# Patient Record
Sex: Male | Born: 1990 | Race: White | Hispanic: No | Marital: Single | Smoking: Current every day smoker
Health system: Southern US, Community
[De-identification: ages and names within clinical notes are randomized; demographics above are authoritative.]

## PROBLEM LIST (undated history)

## (undated) HISTORY — PX: ABDOMINAL SURGERY: SHX537

---
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
## 2010-03-04 ENCOUNTER — Emergency Department (HOSPITAL_COMMUNITY): Admission: EM | Admit: 2010-03-04 | Discharge: 2010-03-04 | Payer: Self-pay | Admitting: Emergency Medicine

## 2011-02-01 LAB — HEMOGLOBIN AND HEMATOCRIT, BLOOD
HCT: 39.8 % (ref 39.0–52.0)
HCT: 40.9 % (ref 39.0–52.0)
Hemoglobin: 13.7 g/dL (ref 13.0–17.0)

## 2011-02-01 LAB — BASIC METABOLIC PANEL
BUN: 9 mg/dL (ref 6–23)
CO2: 27 mEq/L (ref 19–32)
CO2: 31 mEq/L (ref 19–32)
Calcium: 9.1 mg/dL (ref 8.4–10.5)
Calcium: 9.8 mg/dL (ref 8.4–10.5)
Chloride: 102 mEq/L (ref 96–112)
Creatinine, Ser: 0.76 mg/dL (ref 0.4–1.5)
GFR calc non Af Amer: >60 mL/min (ref >60)
Sodium: 135 mEq/L (ref 135–145)
Sodium: 138 mEq/L (ref 135–145)

## 2011-02-01 LAB — DIFFERENTIAL
Basophils Absolute: 0 10*3/uL (ref 0.0–0.1)
Eosinophils Absolute: 0 10*3/uL (ref 0.0–0.7)
Eosinophils Relative: 0 % (ref 0–5)
Lymphocytes Relative: 18 % (ref 12–46)
Lymphs Abs: 1.5 10*3/uL (ref 0.7–4.0)
Monocytes Absolute: 0.7 10*3/uL (ref 0.1–1.0)
Neutrophils Relative %: 73 % (ref 43–77)

## 2011-02-01 LAB — CBC
MCHC: 33.8 g/dL (ref 30.0–36.0)
MCV: 91 fL (ref 78.0–100.0)
MCV: 92.6 fL (ref 78.0–100.0)
Platelets: 220 10*3/uL (ref 150–400)
RBC: 4.66 MIL/uL (ref 4.22–5.81)
RBC: 4.76 MIL/uL (ref 4.22–5.81)
RDW: 12.8 % (ref 11.5–15.5)
WBC: 10.4 10*3/uL (ref 4.0–10.5)
WBC: 8.2 10*3/uL (ref 4.0–10.5)

## 2011-02-12 LAB — POCT RAPID STREP A (OFFICE): Streptococcus, Group A Screen (Direct): NEGATIVE

## 2011-06-27 ENCOUNTER — Inpatient Hospital Stay (INDEPENDENT_AMBULATORY_CARE_PROVIDER_SITE_OTHER)
Admission: RE | Admit: 2011-06-27 | Discharge: 2011-06-27 | Disposition: A | Discharge status: Home / Self Care | Payer: BC Managed Care – PPO | Source: Ambulatory Visit | Attending: Family Medicine | Admitting: Family Medicine

## 2011-06-27 DIAGNOSIS — R319 Hematuria, unspecified: Secondary | ICD-10-CM

## 2011-06-27 LAB — COMPREHENSIVE METABOLIC PANEL WITH GFR
ALT: 30 U/L (ref 0–53)
AST: 25 U/L (ref 0–37)
Albumin: 4.6 g/dL (ref 3.5–5.2)
Alkaline Phosphatase: 75 U/L (ref 39–117)
BUN: 14 mg/dL (ref 6–23)
CO2: 28 meq/L (ref 19–32)
Calcium: 10.1 mg/dL (ref 8.4–10.5)
Chloride: 103 meq/L (ref 96–112)
Creatinine, Ser: 0.98 mg/dL (ref 0.50–1.35)
GFR calc Af Amer: >60 mL/min
GFR calc non Af Amer: >60 mL/min
Glucose, Bld: 92 mg/dL (ref 70–99)
Potassium: 4.3 meq/L (ref 3.5–5.1)
Sodium: 140 meq/L (ref 135–145)
Total Bilirubin: 0.7 mg/dL (ref 0.3–1.2)
Total Protein: 7.9 g/dL (ref 6.0–8.3)

## 2011-06-27 LAB — CBC
HCT: 43.9 % (ref 39.0–52.0)
Hemoglobin: 15.7 g/dL (ref 13.0–17.0)
MCH: 31.6 pg (ref 26.0–34.0)
MCHC: 35.8 g/dL (ref 30.0–36.0)
MCV: 88.3 fL (ref 78.0–100.0)
Platelets: 227 10*3/uL (ref 150–400)
RBC: 4.97 MIL/uL (ref 4.22–5.81)
RDW: 12.2 % (ref 11.5–15.5)
WBC: 4.8 10*3/uL (ref 4.0–10.5)

## 2011-06-27 LAB — POCT URINALYSIS DIP (DEVICE)
Bilirubin Urine: NEGATIVE
Glucose, UA: NEGATIVE mg/dL
Ketones, ur: NEGATIVE mg/dL
Leukocytes, UA: NEGATIVE
Nitrite: NEGATIVE
Protein, ur: NEGATIVE mg/dL
Specific Gravity, Urine: 1.015 (ref 1.005–1.030)
Urobilinogen, UA: 0.2 mg/dL (ref 0.0–1.0)
pH: 8.5 — ABNORMAL HIGH (ref 5.0–8.0)

## 2011-06-27 LAB — SEDIMENTATION RATE: Sed Rate: 2 mm/h (ref 0–16)

## 2011-06-27 LAB — DIFFERENTIAL
Basophils Absolute: 0 10*3/uL (ref 0.0–0.1)
Basophils Relative: 1 % (ref 0–1)
Lymphocytes Relative: 36 % (ref 12–46)
Neutro Abs: 2.3 10*3/uL (ref 1.7–7.7)
Neutrophils Relative %: 47 % (ref 43–77)

## 2011-06-27 LAB — POCT I-STAT, CHEM 8
BUN: 15 mg/dL (ref 6–23)
Glucose, Bld: 94 mg/dL (ref 70–99)
Potassium: 4.5 mEq/L (ref 3.5–5.1)
Sodium: 140 mEq/L (ref 135–145)

## 2011-06-27 LAB — CK: Total CK: 203 U/L (ref 7–232)

## 2014-07-28 ENCOUNTER — Encounter (HOSPITAL_COMMUNITY): Payer: Self-pay | Admitting: Emergency Medicine

## 2014-07-28 ENCOUNTER — Emergency Department (HOSPITAL_COMMUNITY)
Admission: EM | Admit: 2014-07-28 | Discharge: 2014-07-28 | Disposition: A | Discharge status: Home / Self Care | Payer: 59 | Attending: Emergency Medicine | Admitting: Emergency Medicine

## 2014-07-28 DIAGNOSIS — M791 Myalgia: Secondary | ICD-10-CM | POA: Diagnosis not present

## 2014-07-28 DIAGNOSIS — Z720 Tobacco use: Secondary | ICD-10-CM | POA: Diagnosis not present

## 2014-07-28 DIAGNOSIS — R51 Headache: Secondary | ICD-10-CM | POA: Insufficient documentation

## 2014-07-28 DIAGNOSIS — J029 Acute pharyngitis, unspecified: Secondary | ICD-10-CM | POA: Diagnosis present

## 2014-07-28 DIAGNOSIS — J069 Acute upper respiratory infection, unspecified: Secondary | ICD-10-CM | POA: Diagnosis not present

## 2014-07-28 DIAGNOSIS — R531 Weakness: Secondary | ICD-10-CM | POA: Insufficient documentation

## 2014-07-28 LAB — RAPID STREP SCREEN (MED CTR MEBANE ONLY): Streptococcus, Group A Screen (Direct): NEGATIVE

## 2014-07-28 MED ORDER — NAPROXEN 500 MG PO TABS: 500.0000 mg | ORAL_TABLET | Freq: Two times a day (BID) | ORAL | Status: DC

## 2014-07-28 NOTE — ED Notes (Signed)
Declined W/C at D/C and was escorted to lobby by RN. 

## 2014-07-28 NOTE — Discharge Instructions (Signed)
Upper Respiratory Infection, Adult An upper respiratory infection (URI) is also sometimes known as the common cold. The upper respiratory tract includes the nose, sinuses, throat, trachea, and bronchi. Bronchi are the airways leading to the lungs. Most people improve within 1 week, but symptoms can last up to 2 weeks. A residual cough may last even longer.  CAUSES Many different viruses can infect the tissues lining the upper respiratory tract. The tissues become irritated and inflamed and often become very moist. Mucus production is also common. A cold is contagious. You can easily spread the virus to others by oral contact. This includes kissing, sharing a glass, coughing, or sneezing. Touching your mouth or nose and then touching a surface, which is then touched by another person, can also spread the virus. SYMPTOMS  Symptoms typically develop 1 to 3 days after you come in contact with a cold virus. Symptoms vary from person to person. They may include:  Runny nose.  Sneezing.  Nasal congestion.  Sinus irritation.  Sore throat.  Loss of voice (laryngitis).  Cough.  Fatigue.  Muscle aches.  Loss of appetite.  Headache.  Low-grade fever. DIAGNOSIS  You might diagnose your own cold based on familiar symptoms, since most people get a cold 2 to 3 times a year. Your caregiver can confirm this based on your exam. Most importantly, your caregiver can check that your symptoms are not due to another disease such as strep throat, sinusitis, pneumonia, asthma, or epiglottitis. Blood tests, throat tests, and X-rays are not necessary to diagnose a common cold, but they may sometimes be helpful in excluding other more serious diseases. Your caregiver will decide if any further tests are required. RISKS AND COMPLICATIONS  You may be at risk for a more severe case of the common cold if you smoke cigarettes, have chronic heart disease (such as heart failure) or lung disease (such as asthma), or if  you have a weakened immune system. The very young and very old are also at risk for more serious infections. Bacterial sinusitis, middle ear infections, and bacterial pneumonia can complicate the common cold. The common cold can worsen asthma and chronic obstructive pulmonary disease (COPD). Sometimes, these complications can require emergency medical care and may be life-threatening. PREVENTION  The best way to protect against getting a cold is to practice good hygiene. Avoid oral or hand contact with people with cold symptoms. Wash your hands often if contact occurs. There is no clear evidence that vitamin C, vitamin E, echinacea, or exercise reduces the chance of developing a cold. However, it is always recommended to get plenty of rest and practice good nutrition. TREATMENT  Treatment is directed at relieving symptoms. There is no cure. Antibiotics are not effective, because the infection is caused by a virus, not by bacteria. Treatment may include:  Increased fluid intake. Sports drinks offer valuable electrolytes, sugars, and fluids.  Breathing heated mist or steam (vaporizer or shower).  Eating chicken soup or other clear broths, and maintaining good nutrition.  Getting plenty of rest.  Using gargles or lozenges for comfort.  Controlling fevers with ibuprofen or acetaminophen as directed by your caregiver.  Increasing usage of your inhaler if you have asthma. Zinc gel and zinc lozenges, taken in the first 24 hours of the common cold, can shorten the duration and lessen the severity of symptoms. Pain medicines may help with fever, muscle aches, and throat pain. A variety of non-prescription medicines are available to treat congestion and runny nose. Your caregiver   can make recommendations and may suggest nasal or lung inhalers for other symptoms.  HOME CARE INSTRUCTIONS   Only take over-the-counter or prescription medicines for pain, discomfort, or fever as directed by your  caregiver.  Use a warm mist humidifier or inhale steam from a shower to increase air moisture. This may keep secretions moist and make it easier to breathe.  Drink enough water and fluids to keep your urine clear or pale yellow.  Rest as needed.  Return to work when your temperature has returned to normal or as your caregiver advises. You may need to stay home longer to avoid infecting others. You can also use a face mask and careful hand washing to prevent spread of the virus. SEEK MEDICAL CARE IF:   After the first few days, you feel you are getting worse rather than better.  You need your caregiver's advice about medicines to control symptoms.  You develop chills, worsening shortness of breath, or brown or red sputum. These may be signs of pneumonia.  You develop yellow or brown nasal discharge or pain in the face, especially when you bend forward. These may be signs of sinusitis.  You develop a fever, swollen neck glands, pain with swallowing, or white areas in the back of your throat. These may be signs of strep throat. SEEK IMMEDIATE MEDICAL CARE IF:   You have a fever.  You develop severe or persistent headache, ear pain, sinus pain, or chest pain.  You develop wheezing, a prolonged cough, cough up blood, or have a change in your usual mucus (if you have chronic lung disease).  You develop sore muscles or a stiff neck. Document Released: 04/09/2001 Document Revised: 01/06/2012 Document Reviewed: 01/19/2014 [employer] Patient Information 2015 [employer]. This information is not intended to replace advice given to you by your health care provider. Make sure you discuss any questions you have with your health care provider.  

## 2014-07-28 NOTE — ED Notes (Signed)
Body aches, h/a, weakness, sore throat, fever.

## 2014-07-28 NOTE — ED Provider Notes (Signed)
CSN: [id number]     Arrival date & time 07/28/14  1805 History   First MD Initiated Contact with Patient 07/28/14 1837     This chart was scribed for non-physician practitioner, Mayme GentaBen  PA-C working with No att. providers found by Arlan OrganAshley Leger, ED Scribe. This patient was seen in room TR10C/TR10C and the patient's care was started at 8:23 PM.   Chief Complaint  Patient presents with  . Weakness  . Fever  . Generalized Body Aches  . Sore Throat   The history is provided by the patient. No language interpreter was used.    HPI Comments: Randy Cross is a 23 y.o. male who presents to the Emergency Department complaining of constant, moderate generalized body aches onset 1 day. Pt also reports sore throat, HA, fever, and weakness. States symptoms have progressively worsened since time of onset. He has not tried any OTC medications. However, pt stayed home from work today to rest. Reports many of his coworkers are experiencing similar symptoms. He denies any cough. No recent insect bites. He denies starting any new medications. [name] admits to experiencing same symptoms approximately 4-5 weeks ago. No known allergies to medications.  History reviewed. No pertinent past medical history. History reviewed. No pertinent past surgical history. History reviewed. No pertinent family history. History  Substance Use Topics  . Smoking status: Current Every Day Smoker  . Smokeless tobacco: Not on file  . Alcohol Use: No    Review of Systems  Constitutional: Positive for fever.  HENT: Positive for sore throat.   Respiratory: Negative for cough.   Musculoskeletal: Positive for myalgias.  Neurological: Positive for weakness and headaches.  All other systems reviewed and are negative.     Allergies  Review of patient's allergies indicates no known allergies.  Home Medications   Prior to Admission medications   Medication Sig Start Date End Date Taking? Authorizing Provider  naproxen  (NAPROSYN) 500 MG tablet Take 1 tablet (500 mg total) by mouth 2 (two) times daily. 07/28/14   Sharlene MottsBenjamin W , PA-C   Triage Vitals: BP 119/61  Pulse 86  Temp(Src) 98.9 F (37.2 C) (Oral)  Resp 16  Ht 5\' 11"  (1.803 m)  Wt 145 lb (65.772 kg)  BMI 20.23 kg/m2  SpO2 100%   Physical Exam  Nursing note and vitals reviewed. Constitutional: He appears well-developed and well-nourished. No distress.  HENT:  Head: Normocephalic and atraumatic.  Mouth/Throat: Uvula is midline. Posterior oropharyngeal erythema present. No oropharyngeal exudate.  No meningeal signs  Eyes: Conjunctivae and EOM are normal. Right eye exhibits no discharge. Left eye exhibits no discharge. No scleral icterus.  Neck: Neck supple.  Cardiovascular: Normal rate, regular rhythm and normal heart sounds.   Pulmonary/Chest: Effort normal and breath sounds normal. No respiratory distress.  Abdominal: Soft. He exhibits no distension.  Neurological: He is alert.  Skin: He is not diaphoretic.    ED Course  Procedures (including critical care time)  DIAGNOSTIC STUDIES: Oxygen Saturation is 97% on RA, Adequate by my interpretation.    COORDINATION OF CARE: 8:23 PM- Will order rapid strep screen .Discussed treatment plan with pt at bedside and pt agreed to plan.     Labs Review Labs Reviewed  RAPID STREP SCREEN  CULTURE, GROUP A STREP    Imaging Review No results found.   EKG Interpretation None      MDM  Vitals stable - WNL -afebrile Pt resting comfortably in ED. PE, timing of symptom onset and clinical  picture suggests a viral illness. Will treat symptomatically Rapid strep negative No concern for meningitis Will DC with naproxen, patient reports he will obtain OTC medications. Discussed f/u with PCP and return precautions, pt very amenable to plan.  Pt stable, in good condition and is appropriate for DC   Final diagnoses:  Viral upper respiratory illness      I personally performed the  services described in this documentation, which was scribed in my presence. The recorded information has been reviewed and is accurate.    Sharlene Motts, PA-C 07/28/14 2024

## 2014-07-29 NOTE — ED Provider Notes (Signed)
Medical screening examination/treatment/procedure(s) were performed by non-physician practitioner and as supervising physician I was immediately available for consultation/collaboration.   EKG Interpretation None        Christopher J. Pollina, MD 07/29/14 0042 

## 2014-07-30 LAB — CULTURE, GROUP A STREP

## 2015-08-21 ENCOUNTER — Encounter (HOSPITAL_COMMUNITY): Payer: Self-pay | Admitting: *Deleted

## 2015-08-21 ENCOUNTER — Emergency Department (HOSPITAL_COMMUNITY)
Admission: EM | Admit: 2015-08-21 | Discharge: 2015-08-21 | Disposition: A | Discharge status: Home / Self Care | Payer: 59 | Attending: Emergency Medicine | Admitting: Emergency Medicine

## 2015-08-21 ENCOUNTER — Emergency Department (HOSPITAL_COMMUNITY): Payer: 59

## 2015-08-21 DIAGNOSIS — Z791 Long term (current) use of non-steroidal anti-inflammatories (NSAID): Secondary | ICD-10-CM | POA: Insufficient documentation

## 2015-08-21 DIAGNOSIS — Y9389 Activity, other specified: Secondary | ICD-10-CM | POA: Insufficient documentation

## 2015-08-21 DIAGNOSIS — Y9289 Other specified places as the place of occurrence of the external cause: Secondary | ICD-10-CM | POA: Insufficient documentation

## 2015-08-21 DIAGNOSIS — X58XXXA Exposure to other specified factors, initial encounter: Secondary | ICD-10-CM | POA: Insufficient documentation

## 2015-08-21 DIAGNOSIS — Y998 Other external cause status: Secondary | ICD-10-CM | POA: Insufficient documentation

## 2015-08-21 DIAGNOSIS — Z720 Tobacco use: Secondary | ICD-10-CM | POA: Diagnosis not present

## 2015-08-21 DIAGNOSIS — S39012A Strain of muscle, fascia and tendon of lower back, initial encounter: Secondary | ICD-10-CM

## 2015-08-21 DIAGNOSIS — S3992XA Unspecified injury of lower back, initial encounter: Secondary | ICD-10-CM | POA: Diagnosis present

## 2015-08-21 MED ORDER — PREDNISONE 20 MG PO TABS: 40.0000 mg | ORAL_TABLET | Freq: Every day | ORAL | Status: DC

## 2015-08-21 MED ORDER — CYCLOBENZAPRINE HCL 10 MG PO TABS: 10.0000 mg | ORAL_TABLET | Freq: Two times a day (BID) | ORAL | Status: DC | PRN

## 2015-08-21 MED ORDER — MELOXICAM 15 MG PO TABS: 15.0000 mg | ORAL_TABLET | Freq: Every day | ORAL | Status: DC

## 2015-08-21 NOTE — Discharge Instructions (Signed)
Take prednisone as directed until gone. Take Mobic as needed for pain. Take Flexeril as needed for muscle spasm. Refer to attached documents for more information.

## 2015-08-21 NOTE — ED Provider Notes (Signed)
CSN: [id number]     Arrival date & time 08/21/15  1228 History  By signing my name below, I, Essence Howell, attest that this documentation has been prepared under the direction and in the presence of Emilia Beck, PA-C Electronically Signed: Charline Bills, ED Scribe 08/21/2015 at 2:36 PM.   Chief Complaint  Patient presents with  . Back Pain   The history is provided by the patient. No language interpreter was used.   HPI Comments: Randy Cross is a 24 y.o. male who presents to the Emergency Department complaining of gradually worsening, intermittent lower back pain since last week, constant since yesterday. Pt states that he initially noticed a tingling sensation over his lower back last week, but states that he now has a constant, aching pain that radiates across his lower back. Pain is exacerbated with bending. No known injury but pt has worked as a Curator for approximately 2 years. No treatments tried PTA. He denies radiation into lower extremities.   History reviewed. No pertinent past medical history. History reviewed. No pertinent past surgical history. History reviewed. No pertinent family history. Social History  Substance Use Topics  . Smoking status: Current Every Day Smoker  . Smokeless tobacco: None  . Alcohol Use: No    Review of Systems  Musculoskeletal: Positive for back pain.  All other systems reviewed and are negative.  Allergies  Review of patient's allergies indicates no known allergies.  Home Medications   Prior to Admission medications   Medication Sig Start Date End Date Taking? Authorizing Provider  naproxen (NAPROSYN) 500 MG tablet Take 1 tablet (500 mg total) by mouth 2 (two) times daily. 07/28/14   Benjamin Cartner, PA-C   BP 132/78 mmHg  Pulse 82  Temp(Src) 98 F (36.7 C) (Oral)  Resp 16  SpO2 100% Physical Exam  Constitutional: He is oriented to person, place, and time. He appears well-developed and well-nourished. No distress.   HENT:  Head: Normocephalic and atraumatic.  Eyes: Conjunctivae and EOM are normal.  Neck: Neck supple. No tracheal deviation present.  Cardiovascular: Normal rate.   Pulmonary/Chest: Effort normal. No respiratory distress.  Musculoskeletal: Normal range of motion.  Midline lumbar spine tenderness to palpation. No paraspinal tenderness. LE strength and sensation intact.   Neurological: He is alert and oriented to person, place, and time.  Skin: Skin is warm and dry.  Psychiatric: He has a normal mood and affect. His behavior is normal.  Nursing note and vitals reviewed.  ED Course  Procedures (including critical care time) DIAGNOSTIC STUDIES: Oxygen Saturation is 100% on RA, normal by my interpretation.    COORDINATION OF CARE: 2:31 PM-Discussed treatment plan which includes XR with pt at bedside and pt agreed to plan.   Labs Review Labs Reviewed - No data to display  Imaging Review Dg Lumbar Spine Complete  08/21/2015  CLINICAL DATA:  One day history of lumbago EXAM: LUMBAR SPINE - COMPLETE 4+ VIEW COMPARISON:  None. FINDINGS: Frontal, lateral, spot lumbosacral lateral, and bilateral oblique views were obtained. There are 5 non-rib-bearing lumbar type vertebral bodies. There is no fracture or spondylolisthesis. Disc spaces appear intact. There is no appreciable facet arthropathy. IMPRESSION: No fracture or spondylolisthesis.  No appreciable arthropathy. Electronically Signed   By: Bretta Bang III M.D.   On: 08/21/2015 14:05   I have personally reviewed and evaluated these images and lab results as part of my medical decision-making.   EKG Interpretation None      MDM   Final  diagnoses:  Low back strain, initial encounter    Patient's xray unremarkable for acute changes. No lower extremity weakness or numbness. I doubt cauda equina or emergent condition at this time. Possibly mildly herniated disc or other inflammation. Patient will be treated with prednisone and  mobic and flexeril at this time.   I personally performed the services described in this documentation, which was scribed in my presence. The recorded information has been reviewed and is accurate.    Emilia BeckKaitlyn , PA-C 08/23/15 0116  Tilden FossaElizabeth Rees, MD 08/23/15 [phone]

## 2015-08-21 NOTE — ED Notes (Signed)
Patient reports yesterday his legs started to feel tired then this am he woke up with back pain. ambulatory without difficulty. No injury. Patient is a Curatormechanic.

## 2016-05-11 ENCOUNTER — Emergency Department (HOSPITAL_COMMUNITY)
Admission: EM | Admit: 2016-05-11 | Discharge: 2016-05-12 | Disposition: A | Discharge status: Home / Self Care | Payer: Self-pay | Attending: Emergency Medicine | Admitting: Emergency Medicine

## 2016-05-11 ENCOUNTER — Encounter (HOSPITAL_COMMUNITY): Payer: Self-pay | Admitting: Emergency Medicine

## 2016-05-11 DIAGNOSIS — T402X4A Poisoning by other opioids, undetermined, initial encounter: Secondary | ICD-10-CM

## 2016-05-11 DIAGNOSIS — Y829 Unspecified medical devices associated with adverse incidents: Secondary | ICD-10-CM | POA: Insufficient documentation

## 2016-05-11 DIAGNOSIS — F17200 Nicotine dependence, unspecified, uncomplicated: Secondary | ICD-10-CM | POA: Insufficient documentation

## 2016-05-11 DIAGNOSIS — T402X1A Poisoning by other opioids, accidental (unintentional), initial encounter: Secondary | ICD-10-CM | POA: Insufficient documentation

## 2016-05-11 MED ORDER — ONDANSETRON HCL 4 MG/2ML IJ SOLN
4.0000 mg | Freq: Once | INTRAMUSCULAR | Status: AC
  Administered 2016-05-11: 4 mg via INTRAVENOUS
  Filled 2016-05-11: qty 2

## 2016-05-11 MED ORDER — SODIUM CHLORIDE 0.9 % IV BOLUS (SEPSIS)
1000.0000 mL | Freq: Once | INTRAVENOUS | Status: AC
  Administered 2016-05-11: 1000 mL via INTRAVENOUS

## 2016-05-11 MED ORDER — NALOXONE HCL 2 MG/2ML IJ SOSY
PREFILLED_SYRINGE | INTRAMUSCULAR | Status: DC | PRN
  Administered 2016-05-11: 2 mg via INTRAVENOUS

## 2016-05-11 NOTE — ED Notes (Signed)
Pt found by homeless man in his car. Pt arrived tdo room blue and unresponsive. Pt given 2mg  narcan at 2247, pt is now AAOx4.

## 2016-05-11 NOTE — ED Notes (Addendum)
Alert and oriented answering questions at this time.  Talking to mother on the phone.

## 2016-05-11 NOTE — ED Notes (Signed)
Pt wheeling to Trauma A blue and unresponsive. Pt has pulses palpable in carotid, shallow agonal breathing.

## 2016-05-11 NOTE — ED Provider Notes (Signed)
CSN: [id number]     Arrival date & time 05/11/16  2247 History   First MD Initiated Contact with Patient 05/11/16 2254     Chief Complaint  Patient presents with  . Drug Overdose     (Consider location/radiation/quality/duration/timing/severity/associated sxs/prior Treatment) Patient is a 25 y.o. male presenting with syncope. History provided by: the homeless passerby who dropped him off.  Loss of Consciousness Episode history:  Single Most recent episode:  Today Timing:  Constant Progression:  Unchanged Witnessed: no   Relieved by:  Nothing Worsened by:  Nothing tried Ineffective treatments:  None tried   History reviewed. No pertinent past medical history. History reviewed. No pertinent past surgical history. No family history on file. Social History  Substance Use Topics  . Smoking status: Current Some Day Smoker  . Smokeless tobacco: None  . Alcohol Use: Yes    Review of Systems  Unable to perform ROS: Patient unresponsive  Cardiovascular: Positive for syncope.      Allergies  Review of patient's allergies indicates no known allergies.  Home Medications   Prior to Admission medications   Medication Sig Start Date End Date Taking? Authorizing Provider  naloxone HCl 4 MG/0.1ML LIQD Spray 1 spray into each nostril in case of opioid overdose in which the patient is hard to arouse or not breathing 05/12/16   Levora Angel, MD   BP 124/90 mmHg  Pulse 64  Temp(Src) 98.6 F (37 C) (Oral)  Resp 16  SpO2 98% Physical Exam  Constitutional: He appears well-developed and well-nourished.  HENT:  Head: Normocephalic and atraumatic.  Eyes: Conjunctivae are normal.  Cardiovascular: Normal rate and normal heart sounds.   No murmur heard. Pulmonary/Chest:  No spontaneous respirations  Abdominal: Soft. There is no tenderness.  Musculoskeletal: He exhibits no edema.  Neurological: He is unresponsive.  Skin: Skin is warm.  Psychiatric: He has a normal mood and affect.  His behavior is normal.  Nursing note and vitals reviewed.   ED Course  Procedures (including critical care time) Labs Review Labs Reviewed - No data to display  Imaging Review No results found. I have personally reviewed and evaluated these images and lab results as part of my medical decision-making.   EKG Interpretation   Date/Time:  Saturday May 11 2016 22:51:21 EDT Ventricular Rate:  119 PR Interval:    QRS Duration: 131 QT Interval:  329 QTC Calculation: 463 R Axis:   99 Text Interpretation:  Sinus tachycardia Nonspecific intraventricular  conduction delay Non-specific ST-t changes No previous tracing Confirmed  by Denton Lank  MD, Caryn Bee ([id number]) on 05/11/2016 11:34:49 PM      MDM   Final diagnoses:  Opioid overdose, undetermined intent, initial encounter    Patient's a 25 year old male presenting for unresponsiveness. He was reportedly found unconscious in his car in a driveway. The homeless man noticed the patient unconscious in his car and drove the patient in the patient's car to the hospital. He was found in the drive through of the emergency department unresponsive by emergency department personnel. The homeless man reportedly did not know this patient or know why he was unconscious and was not able to provide history.  On initial assessment the patient had a pulse but no visible chest rise or spontaneous respirations. He was immediately bagged with the bag valve mask and was given 2 mg of Narcan. Seconds after administration of Narcan the patient awoke and tried to sit up and began breathing spontaneously. He was asking questions about what happened and  how he got here stating he was scared.  EKG showed a ventricular rate of 119 bpm with no evidence of ischemia, abnormal intervals, or dysrhythmia.  The patient denies any use of narcotics or heroin tonight. He was observed in the emergency department for 2.5 hours with no relapse in sleepiness or respiratory depression.  He denies any suicidality or intent to harm himself. He was able to be discharged home in good condition with a prescription for Narcan and resources for substance abuse recovery.    Levora AngelEric , MD 05/12/16 [id number]  Cathren LaineKevin Steinl, MD 05/12/16 [phone]

## 2016-05-12 MED ORDER — NALOXONE HCL 4 MG/0.1ML NA LIQD: NASAL | Status: DC

## 2016-05-12 NOTE — Discharge Instructions (Signed)
Community Resource Guide Outpatient Counseling/Substance Abuse Adolescent The United Ways 211 is a great source of information about community services available.  Access by dialing 2-1-1 from anywhere in [place], or by website -  CustodianSupply.fi.   Other Local Resources (Updated 10/2015)  [facility]  Crisis Hotline, available 24 hours a day, 7 days a week: [place], [place]   [facility]  Crisis Hotline, available 24 hours a day, 7 days a week: [place], [place]  [facility]  Suicide Prevention Hotline, available 24 hours a day, 7 days a week: [place], [place], available 24 hours a day, 7 days a week: [place], [place] Access to [employer], available 24 hours a day, 7 days a week: (803)690-4643 All   [facility]  Crisis Hotline, available 24 hours a day, 7 days a week: 636-589-1206 All   Other Local Resources (Updated 10/2015)  Outpatient Counseling/ Substance Abuse Programs  Services     Address and Phone Number  [facility]  Offers individual counseling [phone] [address], [place], [place] [address]  [place] Medicare, private pay, and private insurance [phone] [address] [place], [place] [address]  [facility]  Provides individual counseling, substance abuse intensive outpatient program (several hours a day, several days a week), day treatment program, and school-based therapy  Blinda [place], Medicaid, private insurance [phone] [address], [place], [place] [address]  [facility]  Offers individual counseling, family counseling, group  therapy, substance abuse intensive outpatient program (several hours a day, several days a week), and a partial hospitalization program [phone] [address] [place], [place] [address]  [phone] [address], [place] [address]  [phone] [place], [place] [address]  [phone] [place] [address] [place], [place] [address]  [facility]  Offers individual and family counseling  Accepts Medicaid and private insurance  Offers a sliding scale for uninsured [phone] [address] [place], [place] [address]  [place] private insurance [phone] [address], [place], [place] [address]  [facility] [place] individual counseling and intensive in-home services [phone] [address], [place], [place] [address]  [facility] individual counseling, family counseling, group therapy, domestic violence counseling  Accepts Medicaid and private insurance  Offers sliding scale for uninsured [phone] [address] [place], [place] [address]  [phone] [facility], [address] [place], [place]  [facility]  Offers individual counseling, family counseling group therapy, and school-based therapy  3 locations - [place], [place], and [place]  [place] [address] [place], [place] [address]  [address] [place], [place] [address]  [place], [place] [address]  [facility]  Offers individual and family counseling  Accepts [place] and private insurance  Offers sliding scale for uninsured [phone] [address] [place], [place] [address]  Launa Flight, MD  Accepts private insurance [phone] [address] [place],  [address]  [facility]   Offers outpatient substance abuse counseling, intensive outpatient substance  abuse programs (several hours a day, several days a week), and residential  substance abuse treatment  [phone], or [phone] [address] [place], [place]  [facility]  Accepts private insurance [phone] [address] [place], [place] [address]  [facility]  Accepts Medicare and private insurance [phone] [address] [place], [place] [address]  [facility]    Offers intensive outpatient program (several hours a day, several days a week)  Accepts private pay and private insurance [phone] [facility] [place], [place]  [facility]    Offers intensive outpatient program (several hours a day, several days a week), and partial hospitalization program [phone] [address] [place], [place] [address]  [facility] counseling  Offers individual and family counseling  Accepts private insurance  Offers sliding scale for uninsured [phone] [address] [place], [place] [address]  [facility]  [place] counseling [phone] [address] [place], [place] [address]  [facility]  Offers individual and family counseling  Offers LGBTQ services  Accepts private insurance and private pay [phone] [address] [place], [place] [address]  [facility]  Offers individual and family counseling  Accepts private insurance [phone] [address] [place], [place] [address]  [facility]   Adolescent Substance Abuse Program (ASAP): [phone]  [facility] [employer] Structured Day Program: [phone] [place], [place]  [facility]  Serves children ages 62 - 23 and their families  Offers intensive in-home treatment and residential programs [phone] [address] [place], [place] [address]      Community Resource  Guide Inpatient Behavioral Health/Residential  Substance Abuse Treatment Adults The United Ways 211 is a great source of information about community services available.  Access by dialing 2-1-1 from anywhere in [place], or by website -  PooledIncome.pl.   (Updated 10/2015)  Crisis Assistance 24 hours a day   Services Offered    [facility] [employer]  24-hour crisis assistance: [phone] [place], [place]   [facility]  24-hour crisis [phone])[phone] [place], [place]  [facility]   24-hour crisis assistance: [phone]) [phone] [place], [place]   [facility] Access to Care Line  24-hour crisis assistance; 517-242-8458 All   [facility]  24-hour crisis response line: 518-125-8203 All   Other Local Resources (Updated 10/2015)  Inpatient Behavioral Health/Residential Substance Abuse Treatment Programs   Services      Address and Phone Number  [facility])   14-day residential rehabilitation  [phone] [address] [place], [place]  [facility] ([facility])    Detox - private pay only  14-day residential rehabilitation -  Medicaid, insurance, private pay only [phone], or [phone] [address] [place], [place] [address]   [facility] [employer] [facility] only  Multiple facilities [phone] admissions   [facility] ([facility])   90-day program  Must be homeless to participate  [phone], or [phone] Marcy Panning, [facility]  [facility] only [phone], or  [phone] [address] [place], [place] [address]  [facility]     Must make an appointment  Transportation is offered from [place] on [employer].  Accepts private pay, Sheryn B[facility] [phone]  [address] [place], [place] [address]     [employer]  Females only  Associated with [facility] [phone] [address] [place], [place] [address]  [facility]  Private insurance only [phone], or [phone] [address] [place], [id number]  [facility]    [facility]  Residential rehabilitation  Private insurance only  Multiple locations [phone] admissions  [facility]    Private pay  Private insurance [phone] [address] [place], [place] [phone]  [facility]    Males only  Fee required at time of admission [phone] [address] [place], [place] [phone]  [facility]    Private pay only  [phone] [address] [place], [place]  [facility] ([facility])    Detox - private pay, Medicaid  Residential rehabilitation for males  - Medicare, Medicaid, insurance, private pay [phone] [address] [place], [place]   [facility]    Walk-in interviews Monday - Saturday from 8 am - 4 pm  Individuals with legal charges are not eligible [phone] [address] [place], [place] [address]  [facility]   Must be willing to work  Must attend Alcoholics Anonymous meetings [phone] [address] [place], [place]   [facility]    Faith-based program  Private pay only [phone] [address] [place], [place]

## 2016-05-13 ENCOUNTER — Encounter (HOSPITAL_COMMUNITY): Payer: Self-pay | Admitting: *Deleted

## 2018-05-20 ENCOUNTER — Emergency Department (HOSPITAL_BASED_OUTPATIENT_CLINIC_OR_DEPARTMENT_OTHER)
Admission: EM | Admit: 2018-05-20 | Discharge: 2018-05-20 | Disposition: A | Discharge status: Home / Self Care | Payer: 59 | Attending: Emergency Medicine | Admitting: Emergency Medicine

## 2018-05-20 ENCOUNTER — Emergency Department (HOSPITAL_BASED_OUTPATIENT_CLINIC_OR_DEPARTMENT_OTHER): Payer: 59

## 2018-05-20 ENCOUNTER — Encounter (HOSPITAL_BASED_OUTPATIENT_CLINIC_OR_DEPARTMENT_OTHER): Payer: Self-pay

## 2018-05-20 ENCOUNTER — Other Ambulatory Visit: Payer: Self-pay

## 2018-05-20 DIAGNOSIS — F17210 Nicotine dependence, cigarettes, uncomplicated: Secondary | ICD-10-CM | POA: Diagnosis not present

## 2018-05-20 DIAGNOSIS — R55 Syncope and collapse: Secondary | ICD-10-CM | POA: Diagnosis present

## 2018-05-20 LAB — CBC WITH DIFFERENTIAL/PLATELET
Basophils Absolute: 0 10*3/uL (ref 0.0–0.1)
Basophils Relative: 0 %
Eosinophils Absolute: 0 10*3/uL (ref 0.0–0.7)
Eosinophils Relative: 1 %
HCT: 40.2 % (ref 39.0–52.0)
Hemoglobin: 14.3 g/dL (ref 13.0–17.0)
Lymphocytes Relative: 36 %
Lymphs Abs: 1.7 10*3/uL (ref 0.7–4.0)
MCH: 32.3 pg (ref 26.0–34.0)
MCHC: 35.6 g/dL (ref 30.0–36.0)
MCV: 90.7 fL (ref 78.0–100.0)
Monocytes Absolute: 0.7 10*3/uL (ref 0.1–1.0)
Monocytes Relative: 16 %
Neutro Abs: 2.2 10*3/uL (ref 1.7–7.7)
Neutrophils Relative %: 47 %
Platelets: 231 10*3/uL (ref 150–400)
RBC: 4.43 MIL/uL (ref 4.22–5.81)
RDW: 12.2 % (ref 11.5–15.5)
WBC: 4.7 10*3/uL (ref 4.0–10.5)

## 2018-05-20 LAB — BASIC METABOLIC PANEL
Anion gap: 10 (ref 5–15)
BUN: 21 mg/dL — ABNORMAL HIGH (ref 6–20)
CO2: 25 mmol/L (ref 22–32)
Calcium: 8.9 mg/dL (ref 8.9–10.3)
Chloride: 101 mmol/L (ref 98–111)
Creatinine, Ser: 0.95 mg/dL (ref 0.61–1.24)
GFR calc Af Amer: >60 mL/min (ref >60)
GFR calc non Af Amer: >60 mL/min (ref >60)
Glucose, Bld: 106 mg/dL — ABNORMAL HIGH (ref 70–99)
Potassium: 3.5 mmol/L (ref 3.5–5.1)
Sodium: 136 mmol/L (ref 135–145)

## 2018-05-20 LAB — TROPONIN I: Troponin I: <0.03 ng/mL (ref <0.03)

## 2018-05-20 LAB — D-DIMER, QUANTITATIVE: D-Dimer, Quant: 0.35 ug/mL-FEU (ref 0.00–0.50)

## 2018-05-20 MED ORDER — SODIUM CHLORIDE 0.9 % IV BOLUS
1000.0000 mL | Freq: Once | INTRAVENOUS | Status: AC
  Administered 2018-05-20: 1000 mL via INTRAVENOUS

## 2018-05-20 NOTE — ED Provider Notes (Signed)
[facility] EMERGENCY DEPARTMENT Provider Note   CSN: [id number] Arrival date & time: 05/20/18  1239     History   Chief Complaint Chief Complaint  Patient presents with  . Near Syncope    HPI Randy Cross is a 27 y.o. male with history of anxiety, depression who presents following a near syncopal episode.  Patient reports he was at work and felt lightheaded like he was going to pass out.  He went and drink plenty of water and had some snacks and it did not help him.  He reports he felt like his legs were going numb time.  He is also had intermittent chest tightness, but he feels like he gets this when he is panicking.  It is not pleuritic.  He has been under a lot of stress lately and reports he struggles with anxiety and depression.  He denies any SI, HI, hallucinations.  He has had a lot going on at home and reports he has had to be situations that he needs to get out of to help his stress levels.  He reports occasional drinking of liquor and occasional marijuana use.  He reports using cocaine 2 weeks ago.  He does not currently take any medication or seeing anyone for his anxiety and depression.  He denies any recent long trips, surgeries, new leg pain or swelling, history of blood clots.  HPI  History reviewed. No pertinent past medical history.  There are no active problems to display for this patient.   Past Surgical History:  Procedure Laterality Date  . ABDOMINAL SURGERY          Home Medications    Prior to Admission medications   Not on File    Family History No family history on file.  Social History Social History   Tobacco Use  . Smoking status: Current Some Day Smoker    Types: Cigarettes  . Smokeless tobacco: Never Used  Substance Use Topics  . Alcohol use: Yes    Comment: weekly  . Drug use: Not Currently    Types: Marijuana, Cocaine     Allergies   Patient has no known allergies.   Review of Systems Review of Systems    Constitutional: Negative for chills and fever.  HENT: Negative for facial swelling and sore throat.   Respiratory: Positive for chest tightness. Negative for shortness of breath.   Cardiovascular: Negative for chest pain.  Gastrointestinal: Negative for abdominal pain, nausea and vomiting.  Genitourinary: Negative for dysuria.  Musculoskeletal: Negative for back pain.  Skin: Negative for rash and wound.  Neurological: Positive for light-headedness and numbness. Negative for syncope and headaches.  Psychiatric/Behavioral: Negative for suicidal ideas. The patient is nervous/anxious.      Physical Exam Updated Vital Signs BP 132/83   Pulse 68   Temp 98 F (36.7 C) (Oral)   Resp 19   Ht 5\' 11"  (1.803 m)   Wt 66.7 kg (147 lb)   SpO2 100%   BMI 20.50 kg/m   Physical Exam  Constitutional: He appears well-developed and well-nourished. No distress.  HENT:  Head: Normocephalic and atraumatic.  Mouth/Throat: Oropharynx is clear and moist. No oropharyngeal exudate.  Eyes: Pupils are equal, round, and reactive to light. Conjunctivae and EOM are normal. Right eye exhibits no discharge. Left eye exhibits no discharge. No scleral icterus.  Neck: Normal range of motion. Neck supple. No thyromegaly present.  Cardiovascular: Normal rate, regular rhythm, normal heart sounds and intact distal pulses. Exam  reveals no gallop and no friction rub.  No murmur heard. Pulmonary/Chest: Effort normal and breath sounds normal. No stridor. No respiratory distress. He has no wheezes. He has no rales. He exhibits no tenderness.  Abdominal: Soft. Bowel sounds are normal. He exhibits no distension. There is no tenderness. There is no rebound and no guarding.  Musculoskeletal: He exhibits no edema.  Lymphadenopathy:    He has no cervical adenopathy.  Neurological: He is alert. Coordination normal.  CN 3-12 intact; normal sensation throughout; 5/5 strength in all 4 extremities; equal bilateral grip strength   Skin: Skin is warm and dry. No rash noted. He is not diaphoretic. No pallor.  Psychiatric: He has a normal mood and affect.  Nursing note and vitals reviewed.    ED Treatments / Results  Labs (all labs ordered are listed, but only abnormal results are displayed) Labs Reviewed  BASIC METABOLIC PANEL - Abnormal; Notable for the following components:      Result Value   Glucose, Bld 106 (*)    BUN 21 (*)    All other components within normal limits  CBC WITH DIFFERENTIAL/PLATELET  TROPONIN I  D-DIMER, QUANTITATIVE (NOT AT [facility])    EKG EKG Interpretation  Date/Time:  Wednesday May 20 2018 12:47:49 EDT Ventricular Rate:  84 PR Interval:  124 QRS Duration: 146 QT Interval:  388 QTC Calculation: 458 R Axis:   85 Text Interpretation:  Normal sinus rhythm with sinus arrhythmia Right bundle branch block Abnormal ECG similar to previous Confirmed by Frederick Peers [phone]) on 05/20/2018 1:06:31 PM   Radiology Dg Chest 2 View  Result Date: 05/20/2018 CLINICAL DATA:  Pain with presyncope EXAM: CHEST - 2 VIEW COMPARISON:  July 16, 2009 FINDINGS: Lungs are clear. Heart size and pulmonary vascularity are normal. No adenopathy. No pneumothorax. No bone lesions. IMPRESSION: No edema or consolidation. Electronically Signed   By: Bretta Bang III M.D.   On: 05/20/2018 13:52    Procedures Procedures (including critical care time)  Medications Ordered in ED Medications  sodium chloride 0.9 % bolus 1,000 mL ( Intravenous Stopped 05/20/18 1531)     Initial Impression / Assessment and Plan / ED Course  I have reviewed the triage vital signs and the nursing notes.  Pertinent labs & imaging results that were available during my care of the patient were reviewed by me and considered in my medical decision making (see chart for details).  Clinical Course as of May 21 1555  Wed May 20, 2018  1454 Patient feeling improved after fluids, will continue.  Patient was not tachycardic on  arrival, however did have an episode of tachycardia up to 107 documented.  Considering near syncope, although low risk, will add d-dimer to rule out PE.   [AL]    Clinical Course User Index [AL] Emi Holes, PA-C    Patient presenting with near syncopal episode.  Suspect dehydration and/or anxiety related.  Labs are unremarkable including troponin and d-dimer.  EKG is NSR and unchanged from past.  Chest x-ray is negative.  Patient feeling better after IV fluids in the ED.  Patient has a few higher blood pressures in the ED, which came down without intervention.  Patient advised to follow-up and establish care with a PCP and given outpatient resources for counseling and mental health.  Return precautions discussed.  Patient understands and agrees with plan.  Patient vitals stable throughout ED course and discharged in satisfactory condition.  Final Clinical Impressions(s) / ED Diagnoses  Final diagnoses:  Near syncope    ED Discharge Orders    None       Verdis Prime 05/20/18 1557    Little, Ambrose [place], MD 05/21/18 [phone]

## 2018-05-20 NOTE — Discharge Instructions (Signed)
Make sure to drink plenty of water and get plenty of rest.  Please follow-up and establish care with a primary care provider for further evaluation and treatment of your symptoms and recheck of your blood pressure.  You can find a primary care provider by calling the number outlined in black new paperwork.  You can also find a list of outpatient counseling resources.

## 2018-05-20 NOTE — ED Triage Notes (Addendum)
Pt states he was at work outside-felt like he was going to pass out approx 30 min PTA-pt NAD-to triage in w/c-pt started crying in triage and states he is under a lot of stress and is depressed-denise SI/HI for second time

## 2018-06-03 ENCOUNTER — Other Ambulatory Visit: Payer: Self-pay | Admitting: Sports Medicine

## 2018-06-03 DIAGNOSIS — M79671 Pain in right foot: Secondary | ICD-10-CM

## 2018-06-12 ENCOUNTER — Ambulatory Visit
Admission: RE | Admit: 2018-06-12 | Discharge: 2018-06-12 | Disposition: A | Discharge status: Home / Self Care | Payer: 59 | Source: Ambulatory Visit | Attending: Sports Medicine | Admitting: Sports Medicine

## 2018-06-12 DIAGNOSIS — M79671 Pain in right foot: Secondary | ICD-10-CM

## 2018-11-21 IMAGING — CT CT FOOT*R* W/O CM
3 of 6 series · 9 of 33 positions shown, 10 images · non-contrast
Comparison: None.

CLINICAL DATA: Right heel pain for 2 weeks

EXAM:
CT OF THE RIGHT FOOT WITHOUT CONTRAST
TECHNIQUE: Multidetector CT imaging of the right foot was performed according
to the standard protocol. Multiplanar CT image reconstructions were
also generated.

[Series 3: sfov lower extremity 2.00 br60 s3 ax · axial · 0.25mm/px · z∈[+583,+711]mm · 3 of 96 slices shown, 4 images]
[im 16/96  soft-tissue]
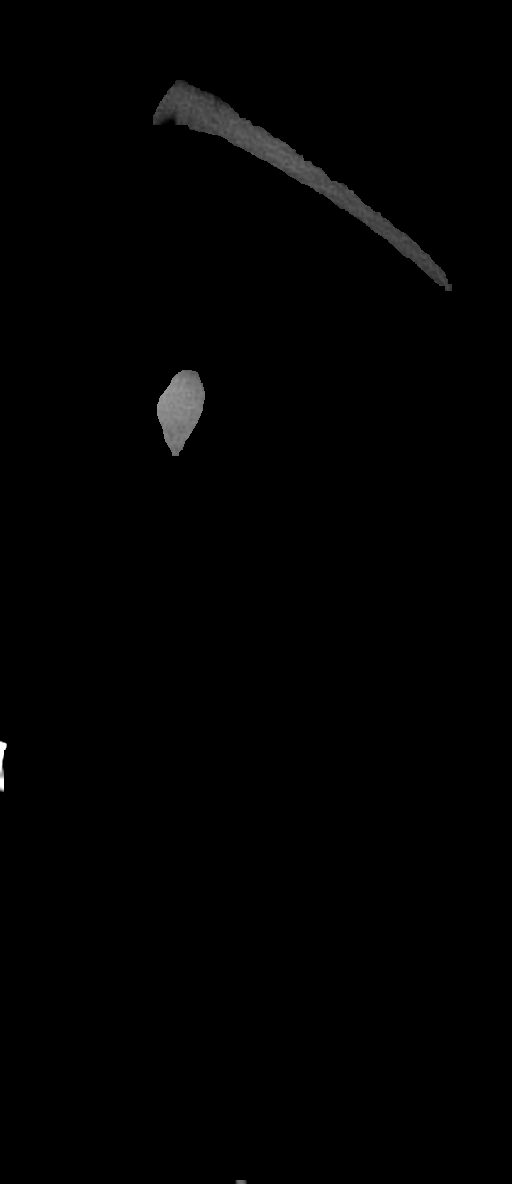
[im 16/96  bone]
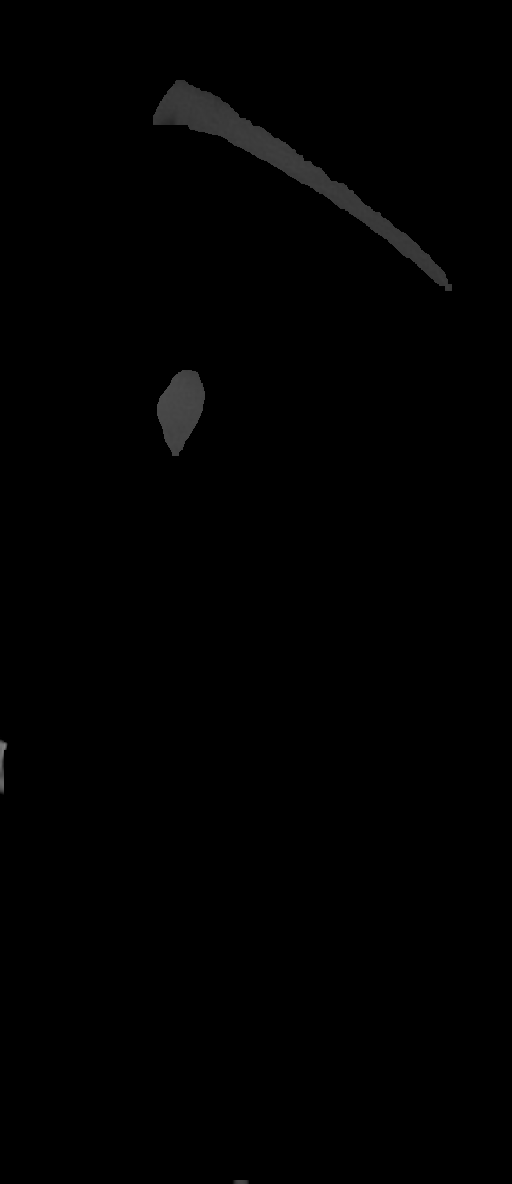
[im 48/96  bone]
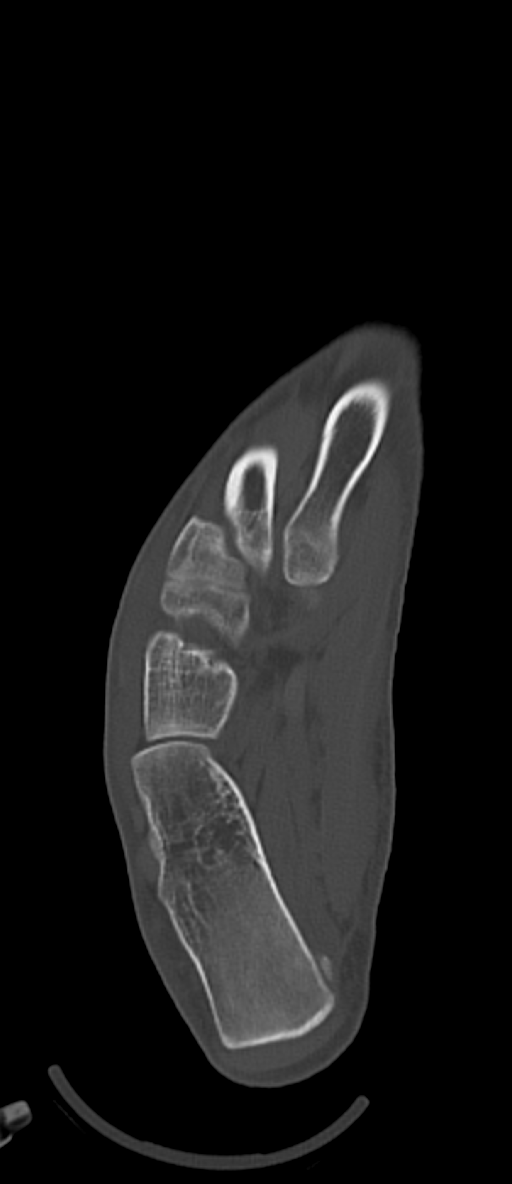
[im 80/96  bone]
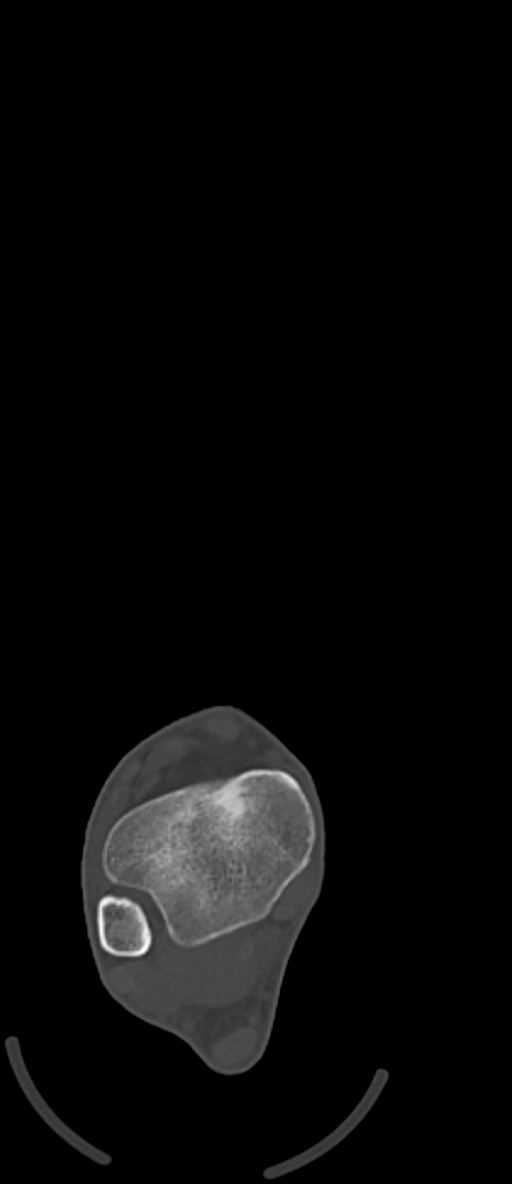

[Series 5: sfov lower extremity 2.00 br60 s3 cor · coronal · 0.25mm/px · 1 of 148 slices shown]
[im 74/148  bone]
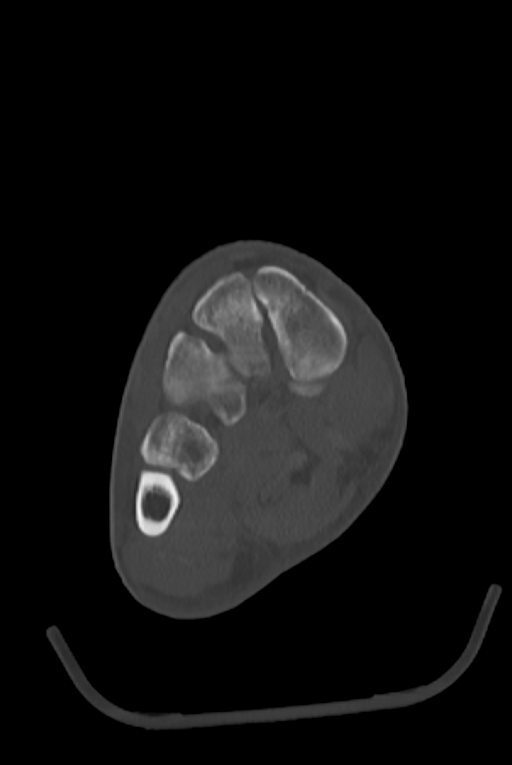

[Series 7: sfov lower extremity 2.00 br60 s3 sag · sagittal · 0.38mm/px · 5 of 64 slices shown]
[im 11/64  bone]
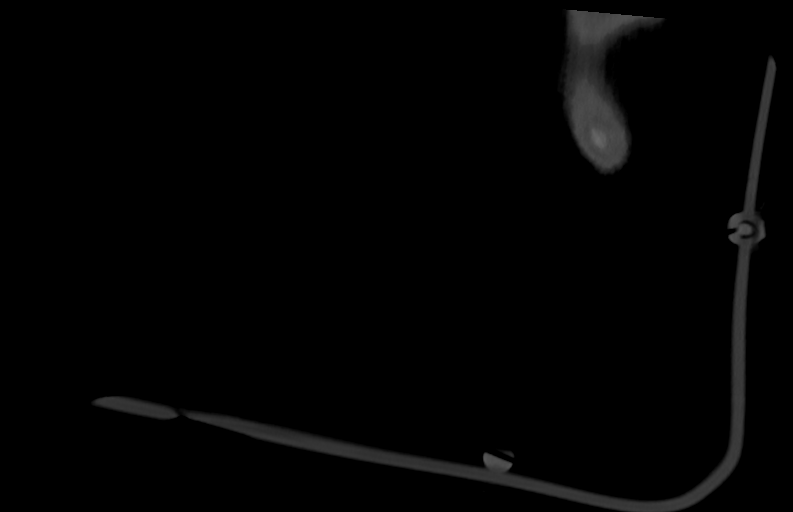
[im 22/64  bone]
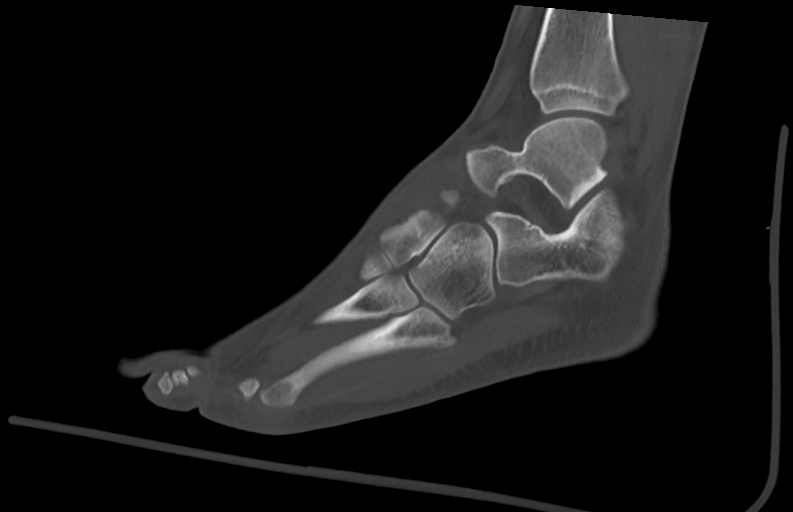
[im 32/64  bone]
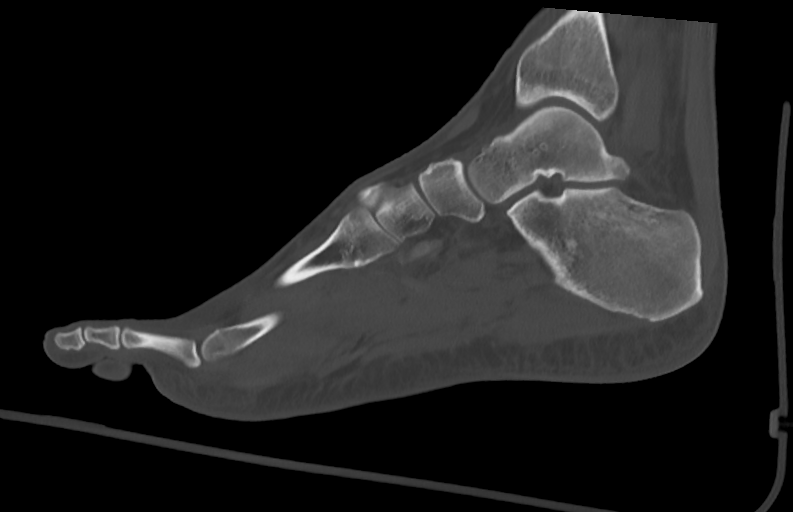
[im 43/64  bone]
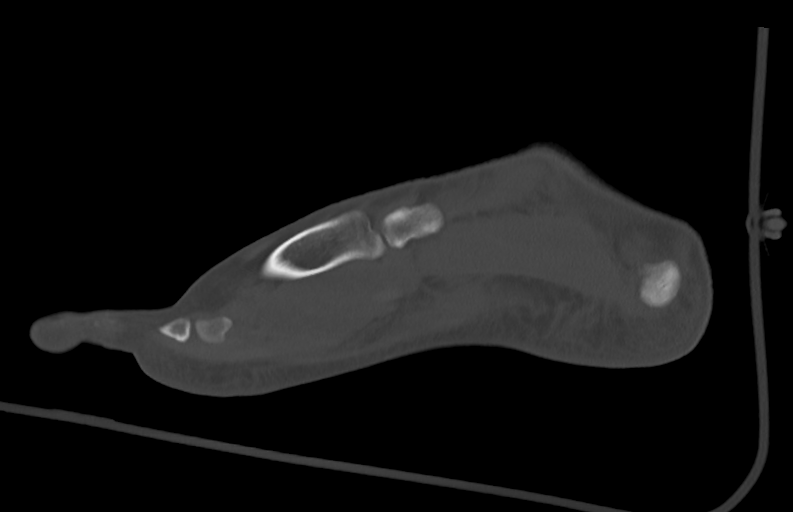
[im 53/64  bone]
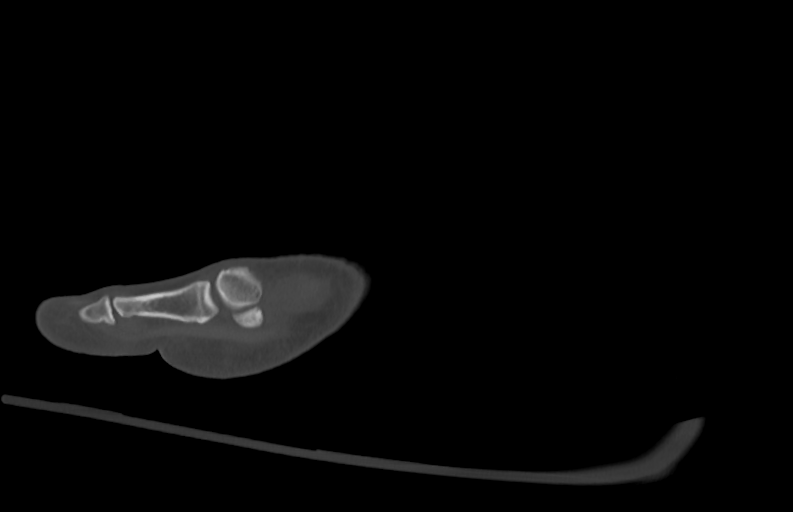

[9 of 33 positions shown; findings below may reference images not displayed]

FINDINGS: Bones/Joint/Cartilage

Acute nondisplaced fracture of the posterior most aspect of the
posteromedial corner of calcaneus along the plantar aspect.

No other acute fracture or dislocation. Normal alignment. No joint
effusion.

Ligaments

Ligaments are suboptimally evaluated by CT.

Muscles and Tendons
Muscles are normal. Flexor, extensor peroneal and Achilles tendons
are intact.

Soft tissue
No fluid collection or hematoma.  No soft tissue mass.
IMPRESSION: Acute nondisplaced fracture of the posterior most aspect of the
posteromedial corner of calcaneus along the plantar aspect.

## 2023-04-16 ENCOUNTER — Ambulatory Visit (HOSPITAL_COMMUNITY)
Admission: EM | Admit: 2023-04-16 | Discharge: 2023-04-16 | Disposition: A | Discharge status: Home / Self Care | Payer: No Payment, Other | Attending: Family Medicine | Admitting: Family Medicine

## 2023-04-16 DIAGNOSIS — F4323 Adjustment disorder with mixed anxiety and depressed mood: Secondary | ICD-10-CM | POA: Insufficient documentation

## 2023-04-16 NOTE — Discharge Instructions (Addendum)
Therapy Walk-in Hours  Monday-Wednesday: 8 AM until slots are full  Friday: 1 PM to 5 PM  For Monday to Wednesday, it is recommended that patients arrive between 7:30 AM  because patients will be seen in the order of arrival.    **Availability is limited; therefore, patients may not be seen on the same day.**  Medication management walk-ins:  Monday to Friday: 8 AM to 11 AM.  It is recommended that patients arrive by 7:30 AM because patients will be seen in the order of arrival.  Go to the second floor on arrival and check in.  **Availability is limited; therefore, patients may not be seen on the same day.**

## 2023-04-16 NOTE — ED Provider Notes (Signed)
Behavioral Health Urgent Care Medical Screening Exam  Patient Name: Randy Cross MRN: [id number] Date of Evaluation: 04/16/23 Chief Complaint:  "Today I decided I need to get help with my mental health" Diagnosis:  Final diagnoses:  Adjustment disorder with mixed anxiety and depressed mood    Randy Cross 32 y.o., male patient presented to [facility] as a walk in, voluntarily accompanied by a friend with complaints of needing to get his head straight and establish with mental health services.  Randy Cross, 32 y.o., male patient seen face to face by this provider, consulted with Dr. Lucianne Muss; and chart reviewed on 04/16/23.    On evaluation Randy Cross reports a history of diagnosis of depression he thinks manic depression as a adolescent.  He has never been psychiatrically admitted to an inpatient facility however briefly was prescribed Seroquel and Hydroxyzine.  He reports a distant history of some self-harm however has never had any suicidal attempts Randy Cross is a history of some passive SI.  Reports he has multiple stressors going on his life at present.  He is currently working a new job which requires him to work 12-hour shifts and this is the first time he is ever had to work these long days then to 5 days a week.  Orts that he has to get up to be at work at 4:30 AM in the morning and he has had a chronic history of insomnia and on average that she is approximately 3-4 sleep per night.  Additional stressors include current custody issues with his 2 children and he is currently displaced regarding housing.  He has been staying with different people while he works to secure personal housing.  Patient is tearful during evaluation reports that today he has been crying uncontrollably and this is not his baseline.  He was unable to go to work due to his extreme emotions.  He reports feeling emotionally overwhelmed and knows that he needs to mental health services in order to get treatment for his  depression.  He endorses smoking marijuana almost daily and occasional alcohol intake.  He denies any other recent illicit substance use although reports he has a distance history of substance use.  He denies any active suicidal ideations or homicidal ideations.  Denies any history of auditory or visual hallucinations.  Patient is requesting outpatient mental health services for medication management and therapy.  During evaluation Randy Cross is in no acute distress.  He is alert, oriented x 4, calm, cooperative and attentive.  His mood is depressed, anxious, with congruent affect.  He has normal speech, and behavior.  Objectively there is no evidence of psychosis/mania or delusional thinking.  Patient is able to converse coherently, goal directed thoughts, no distractibility, or pre-occupation.  He also denies suicidal/self-harm/homicidal ideation, psychosis, and paranoia.  Patient answered question appropriately.      Flowsheet Row ED from 04/16/2023 in [facility]  C-SSRS RISK CATEGORY Low Risk       Psychiatric Specialty Exam  Presentation  General Appearance:Other (comment) (Multiple body tattoo)  Eye Contact:Fair  Speech:Clear and Coherent  Speech Volume:Normal  Handedness:Right   Mood and Affect  Mood:Depressed  Affect:Tearful; Flat   Thought Process  Thought Processes:Coherent  Descriptions of Associations:Intact  Orientation:Full (Time, Place and Person)  Thought Content:Logical    Hallucinations:None  Ideas of Reference:None  Suicidal Thoughts:No  Homicidal Thoughts:No   Sensorium  Memory:Immediate Good; Recent Good; Remote Good  Judgment:Good  Insight:Fair  Executive Functions  Concentration:Fair  Attention Span:Fair  Recall:No data recorded [employer] of Knowledge:Good  Language:Good   Psychomotor Activity  Psychomotor Activity:Normal   Assets  Assets:Communication Skills; Desire for Improvement; Physical  Health; Resilience; Social Support   Sleep  Sleep:Poor  Number of hours: 3   Physical Exam: Physical Exam Vitals reviewed.  HENT:     Head: Normocephalic and atraumatic.  Eyes:     Pupils: Pupils are equal, round, and reactive to light.  Cardiovascular:     Rate and Rhythm: Normal rate and regular rhythm.  Pulmonary:     Effort: Pulmonary effort is normal.     Breath sounds: Normal breath sounds.  Musculoskeletal:     Cervical back: Normal range of motion.  Skin:    General: Skin is warm.     Comments: Heavily tattoo   Neurological:     General: No focal deficit present.     Mental Status: He is alert.     Review of Systems  Psychiatric/Behavioral:  Positive for depression and substance abuse. Negative for hallucinations, memory loss and suicidal ideas. The patient is nervous/anxious and has insomnia.     Blood pressure (!) 131/104, pulse 60, temperature 98.5 F (36.9 C), temperature source Oral, resp. rate 19, SpO2 100 %. There is no height or weight on file to calculate BMI.  Musculoskeletal: Strength & Muscle Tone: within normal limits Gait & Station: normal Patient leans: N/A   [facility] MSE Discharge Disposition for Follow up and Recommendations: Based on my evaluation the patient does not appear to have an emergency medical condition and can be discharged with resources and follow up care in outpatient services for Medication Management and Individual Therapy-[place] behavioral health urgent care outpatient clinic.   Joaquin Courts, NP-C  04/16/2023, 10:45 PM

## 2023-04-16 NOTE — ED Notes (Signed)
Patient A&O x 4, ambulatory. Patient discharged in no acute distress. Patient denied SI/HI, A/VH upon discharge. Patient verbalized understanding of all discharge instructions explained by staff, to include follow up appointments and safety plan. Pt belongings returned to patient from locker (orange)  intact. Patient escorted to lobby via staff for transport to destination. Safety maintained.

## 2023-04-16 NOTE — Progress Notes (Signed)
   04/16/23 1548  [facility] Triage Screening (Walk-ins at [facility] only)  How Did You Hear About [place]? Self  What Is the Reason for Your Visit/Call Today? Randy Cross is a 32 year old male presenting to [facility] requesting evaluation for depression. Patient initially having a hard time articulating what is going on and he keeps saying he needs to get his head straight. Patient reports feeling alone, like he does not belong here and today he has been overwhelmed with emotions, crying and not being able to "bounce back". Pt states that his girlfriend suggested that he come here for evaluation. Pt reports he works 12 hours and sleeps about 3 hours a night. Pt denies current SI, HI, AVH and SIB. THC use daily and had some alcoholic beverages last night. Pt reports history of trauma and prior dx of manic depression. Pt tearful in triage.  How Long Has This Been Causing You Problems? > than 6 months  Have You Recently Had Any Thoughts About Hurting Yourself? No  Are You Planning to Commit Suicide/Harm Yourself At This time? No  Have you Recently Had Thoughts About Hurting Someone Karolee Ohs? No  Are You Planning To Harm Someone At This Time? No  Are you currently experiencing any auditory, visual or other hallucinations? No  Have You Used Any Alcohol or Drugs in the Past 24 Hours? Yes  How long ago did you use Drugs or Alcohol? last night THC and alcoholic beverages  What Did You Use and How Much? unknown  Do you have any current medical co-morbidities that require immediate attention? No  Clinician description of patient physical appearance/behavior: tearful  What Do You Feel Would Help You the Most Today? Treatment for Depression or other mood problem  If access to [facility] was not available, would you have sought care in the Emergency Department? No  Determination of Need Routine (7 days)  Options For Referral Medication Management;Outpatient Therapy

## 2023-04-25 NOTE — Progress Notes (Signed)
Psychiatric Initial Adult Assessment  Patient Identification: Randy Cross MRN:  [id number] Date of Evaluation:  04/25/2023 Referral Source: ***  Assessment:  Randy Cross is a 32 y.o. male with a history of *** who presents to [facility] via video conferencing for initial evaluation of ***.  Patient reports ***  Plan:  # *** Past medication trials:  Status of problem: *** Interventions: -- ***  # *** Past medication trials:  Status of problem: *** Interventions: -- ***  # *** Past medication trials:  Status of problem: *** Interventions: -- ***  Patient was given contact information for behavioral health clinic and was instructed to call 911 for emergencies.   Subjective:  Chief Complaint: No chief complaint on file.   History of Present Illness:  ***  Chart review: -- [facility] presentation 04/16/23: presented voluntarily reporting depressive symptoms and overwhelm related to numerous stressors (long hours at work; custody issues with 2 children; currently without housing). Requesting outpatient resources. Diagnoses felt c/w adjustment disorder with mixed anxiety and depressed mood. No acute safety concern at that time.   Reporting hx of "manic depression" Past substance use'  Past Psychiatric History:  Diagnoses: ***anxiety, depression Medication trials: ***Seroquel, Atarax Previous psychiatrist/therapist: *** Hospitalizations: ***denies Suicide attempts: *** SIB: *** Hx of violence towards others: *** Current access to guns: *** Hx of trauma/abuse: ***  Previous Psychotropic Medications: {YES/NO:21197}  Substance Abuse History in the last 12 months:  {yes no:314532}  -- Cannabis: daily ***  -- Cocaine:  -- Opioids:  -- Etoh:   Past Medical History: No past medical history on file.  Past Surgical History:  Procedure Laterality Date   ABDOMINAL SURGERY      Family Psychiatric History: ***  Family History: No family history  on file.  Social History:   Academic/Vocational: ***  Social History   Socioeconomic History   Marital status: Single    Spouse name: Not on file   Number of children: Not on file   Years of education: Not on file   Highest education level: Not on file  Occupational History   Not on file  Tobacco Use   Smoking status: Some Days    Types: Cigarettes   Smokeless tobacco: Never  Substance and Sexual Activity   Alcohol use: Yes    Comment: weekly   Drug use: Not Currently    Types: Marijuana, Cocaine   Sexual activity: Not on file  Other Topics Concern   Not on file  Social History Narrative   ** Merged History Encounter **       Social Determinants of Health   Financial Resource Strain: Not on file  Food Insecurity: Not on file  Transportation Needs: Not on file  Physical Activity: Not on file  Stress: Not on file  Social Connections: Not on file    Additional Social History: updated  Allergies:  No Known Allergies  Current Medications: No current outpatient medications on file.   No current facility-administered medications for this visit.    ROS: Review of Systems  Objective:  Psychiatric Specialty Exam: There were no vitals taken for this visit.There is no height or weight on file to calculate BMI.  General Appearance: {Appearance:22683}  Eye Contact:  {BHH EYE CONTACT:22684}  Speech:  {Speech:22685}  Volume:  {Volume (PAA):22686}  Mood:  {BHH MOOD:22306}  Affect:  {Affect (PAA):22687}  Thought Content: {Thought Content:22690}   Suicidal Thoughts:  {ST/HT (PAA):22692}  Homicidal Thoughts:  {ST/HT (PAA):22692}  Thought Process:  {  Thought Process (PAA):22688}  Orientation:  {BHH ORIENTATION (PAA):22689}    Memory: Grossly intact ***  Judgment:  {Judgement (PAA):22694}  Insight:  {Insight (PAA):22695}  Concentration:  {Concentration:21399}  Recall:  not formally assessed ***  Fund of Knowledge: {BHH GOOD/FAIR/POOR:22877}  Language: {BHH  GOOD/FAIR/POOR:22877}  Psychomotor Activity:  {Psychomotor (PAA):22696}  Akathisia:  {BHH YES OR NO:22294}  AIMS (if indicated): {Desc; done/not:10129}  Assets:  {Assets (PAA):22698}  ADL's:  {BHH ZOX'W:96045}  Cognition: {chl bhh cognition:304700322}  Sleep:  {BHH GOOD/FAIR/POOR:22877}   PE: General: sits comfortably in view of camera; no acute distress *** Pulm: no increased work of breathing on room air *** MSK: all extremity movements appear intact *** Neuro: no focal neurological deficits observed *** Gait & Station: unable to assess by video ***   Metabolic Disorder Labs: No results found for: "HGBA1C", "MPG" No results found for: "PROLACTIN" No results found for: "CHOL", "TRIG", "HDL", "CHOLHDL", "VLDL", "LDLCALC" No results found for: "TSH"  Therapeutic Level Labs: No results found for: "LITHIUM" No results found for: "CBMZ" No results found for: "VALPROATE"  Screenings:  Flowsheet Row ED from 04/16/2023 in [facility]  C-SSRS RISK CATEGORY Low Risk       Collaboration of Care: Collaboration of Care: [facility] OP Collaboration of Care:[id number]}  Patient/Guardian was advised Release of Information must be obtained prior to any record release in order to collaborate their care with an outside provider. Patient/Guardian was advised if they have not already done so to contact the registration department to sign all necessary forms in order for [place] to release information regarding their care.   Consent: Patient/Guardian gives verbal consent for treatment and assignment of benefits for services provided during this visit. Patient/Guardian expressed understanding and agreed to proceed.   Televisit via video: I connected with Kathlene Cote on 04/25/23 at 10:30 AM EDT by a video enabled telemedicine application and verified that I am speaking with the correct person using two identifiers.  Location: Patient: *** Provider: remote office in [place]   I  discussed the limitations of evaluation and management by telemedicine and the availability of in person appointments. The patient expressed understanding and agreed to proceed.  I discussed the assessment and treatment plan with the patient. The patient was provided an opportunity to ask questions and all were answered. The patient agreed with the plan and demonstrated an understanding of the instructions.   The patient was advised to call back or seek an in-person evaluation if the symptoms worsen or if the condition fails to improve as anticipated.  I provided *** minutes of non-face-to-face time during this encounter.   A Christell Steinmiller 6/28/20241:43 PM

## 2023-04-28 ENCOUNTER — Encounter (HOSPITAL_COMMUNITY): Payer: No Payment, Other | Admitting: Psychiatry

## 2023-05-30 ENCOUNTER — Ambulatory Visit (HOSPITAL_COMMUNITY): Payer: No Payment, Other | Admitting: Psychiatry

## 2023-06-17 ENCOUNTER — Encounter (HOSPITAL_COMMUNITY): Payer: Self-pay | Admitting: Emergency Medicine

## 2023-06-17 ENCOUNTER — Ambulatory Visit (HOSPITAL_COMMUNITY)
Admission: EM | Admit: 2023-06-17 | Discharge: 2023-06-17 | Disposition: A | Discharge status: Home / Self Care | Payer: Self-pay

## 2023-06-17 ENCOUNTER — Encounter (HOSPITAL_COMMUNITY): Payer: Self-pay

## 2023-06-17 ENCOUNTER — Emergency Department (HOSPITAL_COMMUNITY)
Admission: EM | Admit: 2023-06-17 | Discharge: 2023-06-18 | Discharge status: Against Medical Advice | Payer: Self-pay | Attending: Emergency Medicine | Admitting: Emergency Medicine

## 2023-06-17 ENCOUNTER — Other Ambulatory Visit: Payer: Self-pay

## 2023-06-17 ENCOUNTER — Emergency Department (HOSPITAL_COMMUNITY): Payer: Self-pay

## 2023-06-17 DIAGNOSIS — N50812 Left testicular pain: Secondary | ICD-10-CM | POA: Insufficient documentation

## 2023-06-17 DIAGNOSIS — Z5321 Procedure and treatment not carried out due to patient leaving prior to being seen by health care provider: Secondary | ICD-10-CM | POA: Insufficient documentation

## 2023-06-17 LAB — URINALYSIS, ROUTINE W REFLEX MICROSCOPIC
Bacteria, UA: NONE SEEN
Bilirubin Urine: NEGATIVE
Glucose, UA: NEGATIVE mg/dL
Ketones, ur: NEGATIVE mg/dL
Leukocytes,Ua: NEGATIVE
Nitrite: NEGATIVE
Protein, ur: NEGATIVE mg/dL
RBC / HPF: >50 RBC/hpf (ref 0–5)
Specific Gravity, Urine: 1.021 (ref 1.005–1.030)
pH: 6 (ref 5.0–8.0)

## 2023-06-17 LAB — CBC WITH DIFFERENTIAL/PLATELET
Abs Immature Granulocytes: 0.02 10*3/uL (ref 0.00–0.07)
Basophils Absolute: 0.1 10*3/uL (ref 0.0–0.1)
Basophils Relative: 1 %
Eosinophils Absolute: 0.1 10*3/uL (ref 0.0–0.5)
Eosinophils Relative: 1 %
HCT: 45.6 % (ref 39.0–52.0)
Hemoglobin: 15.4 g/dL (ref 13.0–17.0)
Immature Granulocytes: 0 %
Lymphocytes Relative: 28 %
Lymphs Abs: 2 10*3/uL (ref 0.7–4.0)
MCH: 31 pg (ref 26.0–34.0)
MCHC: 33.8 g/dL (ref 30.0–36.0)
MCV: 91.9 fL (ref 80.0–100.0)
Monocytes Absolute: 0.7 10*3/uL (ref 0.1–1.0)
Monocytes Relative: 9 %
Neutro Abs: 4.4 10*3/uL (ref 1.7–7.7)
Neutrophils Relative %: 61 %
Platelets: 343 10*3/uL (ref 150–400)
RBC: 4.96 MIL/uL (ref 4.22–5.81)
RDW: 12.4 % (ref 11.5–15.5)
WBC: 7.2 10*3/uL (ref 4.0–10.5)
nRBC: 0 % (ref 0.0–0.2)

## 2023-06-17 LAB — BASIC METABOLIC PANEL
Anion gap: 12 (ref 5–15)
BUN: 16 mg/dL (ref 6–20)
CO2: 26 mmol/L (ref 22–32)
Calcium: 9.9 mg/dL (ref 8.9–10.3)
Chloride: 99 mmol/L (ref 98–111)
Creatinine, Ser: 1.05 mg/dL (ref 0.61–1.24)
GFR, Estimated: >60 mL/min (ref >60)
Glucose, Bld: 92 mg/dL (ref 70–99)
Potassium: 4.3 mmol/L (ref 3.5–5.1)
Sodium: 137 mmol/L (ref 135–145)

## 2023-06-17 MED ORDER — HYDROCODONE-ACETAMINOPHEN 5-325 MG PO TABS
1.0000 | ORAL_TABLET | Freq: Once | ORAL | Status: AC
  Administered 2023-06-17: 1 via ORAL
  Filled 2023-06-17: qty 1

## 2023-06-17 NOTE — ED Triage Notes (Signed)
Pt reports since yesterday has been having testicular pain and swelling. Pt seen at Urgent Care earlier today and sent here for a testicular ultrasound. Denies dysuria, fever. VSS.

## 2023-06-17 NOTE — ED Provider Triage Note (Signed)
Emergency Medicine Provider Triage Evaluation Note  Randy Cross , a 32 y.o. male  was evaluated in triage.  Pt complains of left testicular pain which began 3 days ago, tried to be evaluated urgent care today but sent in for further evaluation due to patient's discomfort.  No known exposures to STIs, but some swelling to his left groin.  No penile discharge or dysuria.  Review of Systems  Positive: Testicular swelling Negative: fever  Physical Exam  BP 131/88 (BP Location: Left Arm)   Pulse (!) 118   Temp 98.5 F (36.9 C) (Oral)   Resp 19   Ht 5\' 11"  (1.803 m)   Wt 70.3 kg   SpO2 99%   BMI 21.62 kg/m  Gen:   Awake, no distress   Resp:  Normal effort  MSK:   Moves extremities without difficulty  Other:  GU exam deferred in triage.   Medical Decision Making  Medically screening exam initiated at 2:01 PM.  Appropriate orders placed.  Randy Cross was informed that the remainder of the evaluation will be completed by another provider, this initial triage assessment does not replace that evaluation, [name]     Claude Manges, PA-C 06/17/23 1402

## 2023-06-17 NOTE — ED Triage Notes (Signed)
Pt presents with 3-day history of swollen glands and testicle pain. Pain is 10/10

## 2023-06-17 NOTE — ED Notes (Signed)
Pt eloped from ED lobby.

## 2023-06-17 NOTE — ED Provider Notes (Signed)
Patient presents today with 10 out of 10 testicle pain that began 3 days ago.  Reports it is in his left testicle and at times feels like the pain is pulling his testicle up into his abdomen.  He denies penile discharge, burning with urination, increased urinary frequency.  Has been taking ibuprofen with improvement in symptoms.  No known exposures to STI.  Endorses some possible swelling in the groin.  Examination of genitalia performed with chaperone, Demetrice Cheree Ditto in the room.  Patient is uncomfortable appearing while sitting down.  He is exquisitely tender to left testicle diffusely.  No discoloration warmth or swelling noted.  No penile discharge.  Bilateral inguinal lymphadenopathy appreciated.  Given significant pain in the testicle, I recommended further evaluation and management in emergency room for further workup to check for testicular torsion.  Patient is in agreement to plan.  Patient is safe to transport via private vehicle.   Valentino Nose, NP 06/17/23 [phone]

## 2023-06-19 ENCOUNTER — Encounter (HOSPITAL_COMMUNITY): Payer: Self-pay | Admitting: Emergency Medicine

## 2023-06-19 ENCOUNTER — Emergency Department (HOSPITAL_COMMUNITY)
Admission: EM | Admit: 2023-06-19 | Discharge: 2023-06-20 | Disposition: A | Discharge status: Home / Self Care | Payer: Self-pay | Attending: Emergency Medicine | Admitting: Emergency Medicine

## 2023-06-19 ENCOUNTER — Emergency Department (HOSPITAL_COMMUNITY): Payer: Self-pay

## 2023-06-19 ENCOUNTER — Other Ambulatory Visit: Payer: Self-pay

## 2023-06-19 DIAGNOSIS — N201 Calculus of ureter: Secondary | ICD-10-CM | POA: Insufficient documentation

## 2023-06-19 LAB — URINALYSIS, ROUTINE W REFLEX MICROSCOPIC
Bacteria, UA: NONE SEEN
Bilirubin Urine: NEGATIVE
Glucose, UA: NEGATIVE mg/dL
Ketones, ur: NEGATIVE mg/dL
Leukocytes,Ua: NEGATIVE
Nitrite: NEGATIVE
Protein, ur: 30 mg/dL — AB
RBC / HPF: >50 RBC/hpf (ref 0–5)
Specific Gravity, Urine: 1.02 (ref 1.005–1.030)
pH: 6 (ref 5.0–8.0)

## 2023-06-19 NOTE — ED Provider Triage Note (Signed)
Emergency Medicine Provider Triage Evaluation Note  Randy Cross , a 32 y.o. male  was evaluated in triage.  Pt complains of left testicle pain since 8 pm. Seen a few days ago for same, eloped prior to evaluation. Returns with severe pain. Trembling, diaphoretic. States it feels like the "string holding the left ball is wrapping up around it on and off".   Review of Systems  Positive: Testicle pain, left hematuria Negative: Fevers  Physical Exam  BP (!) 152/103 (BP Location: Right Arm)   Pulse (!) 109   Temp 97.7 F (36.5 C) (Oral)   Resp 20   SpO2 100%  Gen:   Awake, acute pain Resp:  Normal effort  MSK:   Moves extremities without difficulty  Other:  GU exam with ED tech as chaperone, left testicle suddenly elevated/high riding during exam, patient cried out stated this is the pain that has come and gone  Medical Decision Making  Medically screening exam initiated at 11:23 PM.  Appropriate orders placed.  Kathlene Cote was informed that the remainder of the evaluation will be completed by another provider, this initial triage assessment does not replace that evaluation, [name]  Concern for testicular torsion, US scrotum with doppler ordered, charge RN aware, patient transported to ED room 16.    Paris Lore, PA-C 06/19/23 2327

## 2023-06-19 NOTE — ED Triage Notes (Signed)
Pt presents with testicular pain and trouble urinating.  Pt had testicular US 2 days ago and eloped before seeing a provider.

## 2023-06-20 ENCOUNTER — Emergency Department (HOSPITAL_COMMUNITY): Payer: Self-pay

## 2023-06-20 ENCOUNTER — Encounter (HOSPITAL_COMMUNITY): Payer: Self-pay | Admitting: Emergency Medicine

## 2023-06-20 ENCOUNTER — Other Ambulatory Visit: Payer: Self-pay

## 2023-06-20 ENCOUNTER — Emergency Department (HOSPITAL_COMMUNITY)
Admission: EM | Admit: 2023-06-20 | Discharge: 2023-06-20 | Disposition: A | Discharge status: Home / Self Care | Payer: Self-pay | Attending: Emergency Medicine | Admitting: Emergency Medicine

## 2023-06-20 DIAGNOSIS — N23 Unspecified renal colic: Secondary | ICD-10-CM | POA: Insufficient documentation

## 2023-06-20 DIAGNOSIS — N50812 Left testicular pain: Secondary | ICD-10-CM | POA: Insufficient documentation

## 2023-06-20 LAB — BASIC METABOLIC PANEL
Anion gap: 11 (ref 5–15)
BUN: 18 mg/dL (ref 6–20)
CO2: 26 mmol/L (ref 22–32)
Calcium: 9.7 mg/dL (ref 8.9–10.3)
Chloride: 103 mmol/L (ref 98–111)
Creatinine, Ser: 1.35 mg/dL — ABNORMAL HIGH (ref 0.61–1.24)
GFR, Estimated: >60 mL/min (ref >60)
Glucose, Bld: 91 mg/dL (ref 70–99)
Potassium: 5 mmol/L (ref 3.5–5.1)
Sodium: 140 mmol/L (ref 135–145)

## 2023-06-20 LAB — CBC WITH DIFFERENTIAL/PLATELET
Abs Immature Granulocytes: 0.03 10*3/uL (ref 0.00–0.07)
Basophils Absolute: 0 10*3/uL (ref 0.0–0.1)
Basophils Relative: 1 %
Eosinophils Absolute: 0.1 10*3/uL (ref 0.0–0.5)
Eosinophils Relative: 1 %
HCT: 40.3 % (ref 39.0–52.0)
Hemoglobin: 14.2 g/dL (ref 13.0–17.0)
Immature Granulocytes: 1 %
Lymphocytes Relative: 26 %
Lymphs Abs: 1.7 10*3/uL (ref 0.7–4.0)
MCH: 32.3 pg (ref 26.0–34.0)
MCHC: 35.2 g/dL (ref 30.0–36.0)
MCV: 91.6 fL (ref 80.0–100.0)
Monocytes Absolute: 0.7 10*3/uL (ref 0.1–1.0)
Monocytes Relative: 10 %
Neutro Abs: 4 10*3/uL (ref 1.7–7.7)
Neutrophils Relative %: 61 %
Platelets: 297 10*3/uL (ref 150–400)
RBC: 4.4 MIL/uL (ref 4.22–5.81)
RDW: 12.3 % (ref 11.5–15.5)
WBC: 6.5 10*3/uL (ref 4.0–10.5)
nRBC: 0 % (ref 0.0–0.2)

## 2023-06-20 LAB — URINALYSIS, ROUTINE W REFLEX MICROSCOPIC
Bilirubin Urine: NEGATIVE
Glucose, UA: NEGATIVE mg/dL
Ketones, ur: NEGATIVE mg/dL
Leukocytes,Ua: NEGATIVE
Nitrite: NEGATIVE
Protein, ur: 30 mg/dL — AB
RBC / HPF: >50 RBC/hpf (ref 0–5)
Specific Gravity, Urine: 1.021 (ref 1.005–1.030)
pH: 6 (ref 5.0–8.0)

## 2023-06-20 LAB — CBC
HCT: 45.4 % (ref 39.0–52.0)
Hemoglobin: 15.7 g/dL (ref 13.0–17.0)
MCH: 32.2 pg (ref 26.0–34.0)
MCHC: 34.6 g/dL (ref 30.0–36.0)
MCV: 93 fL (ref 80.0–100.0)
Platelets: 350 10*3/uL (ref 150–400)
RBC: 4.88 MIL/uL (ref 4.22–5.81)
RDW: 12.5 % (ref 11.5–15.5)
WBC: 8.5 10*3/uL (ref 4.0–10.5)
nRBC: 0 % (ref 0.0–0.2)

## 2023-06-20 LAB — COMPREHENSIVE METABOLIC PANEL
ALT: 65 U/L — ABNORMAL HIGH (ref 0–44)
AST: 52 U/L — ABNORMAL HIGH (ref 15–41)
Albumin: 3.7 g/dL (ref 3.5–5.0)
Alkaline Phosphatase: 57 U/L (ref 38–126)
Anion gap: 10 (ref 5–15)
BUN: 20 mg/dL (ref 6–20)
CO2: 23 mmol/L (ref 22–32)
Calcium: 8.6 mg/dL — ABNORMAL LOW (ref 8.9–10.3)
Chloride: 102 mmol/L (ref 98–111)
Creatinine, Ser: 1 mg/dL (ref 0.61–1.24)
GFR, Estimated: >60 mL/min (ref >60)
Glucose, Bld: 119 mg/dL — ABNORMAL HIGH (ref 70–99)
Potassium: 4.6 mmol/L (ref 3.5–5.1)
Sodium: 135 mmol/L (ref 135–145)
Total Bilirubin: 0.7 mg/dL (ref 0.3–1.2)
Total Protein: 6.1 g/dL — ABNORMAL LOW (ref 6.5–8.1)

## 2023-06-20 MED ORDER — ONDANSETRON HCL 4 MG/2ML IJ SOLN
4.0000 mg | Freq: Once | INTRAMUSCULAR | Status: AC
  Administered 2023-06-20: 4 mg via INTRAVENOUS
  Filled 2023-06-20: qty 2

## 2023-06-20 MED ORDER — HYDROMORPHONE HCL 1 MG/ML IJ SOLN
0.5000 mg | Freq: Once | INTRAMUSCULAR | Status: AC
  Administered 2023-06-20: 0.5 mg via INTRAVENOUS
  Filled 2023-06-20: qty 1

## 2023-06-20 MED ORDER — KETOROLAC TROMETHAMINE 15 MG/ML IJ SOLN
15.0000 mg | Freq: Once | INTRAMUSCULAR | Status: AC
  Administered 2023-06-20: 15 mg via INTRAVENOUS
  Filled 2023-06-20: qty 1

## 2023-06-20 MED ORDER — HYDROMORPHONE HCL 1 MG/ML IJ SOLN
1.0000 mg | Freq: Once | INTRAMUSCULAR | Status: AC
  Administered 2023-06-20: 1 mg via INTRAVENOUS
  Filled 2023-06-20: qty 1

## 2023-06-20 MED ORDER — OXYCODONE-ACETAMINOPHEN 5-325 MG PO TABS: 1.0000 | ORAL_TABLET | Freq: Four times a day (QID) | ORAL | 0 refills | Status: DC | PRN

## 2023-06-20 MED ORDER — TAMSULOSIN HCL 0.4 MG PO CAPS: 0.4000 mg | ORAL_CAPSULE | Freq: Every day | ORAL | 0 refills | Status: DC

## 2023-06-20 MED ORDER — KETOROLAC TROMETHAMINE 30 MG/ML IJ SOLN
30.0000 mg | Freq: Once | INTRAMUSCULAR | Status: AC
  Administered 2023-06-20: 30 mg via INTRAVENOUS
  Filled 2023-06-20: qty 1

## 2023-06-20 MED ORDER — FENTANYL CITRATE PF 50 MCG/ML IJ SOSY
50.0000 ug | PREFILLED_SYRINGE | Freq: Once | INTRAMUSCULAR | Status: AC
  Administered 2023-06-20: 50 ug via INTRAVENOUS
  Filled 2023-06-20: qty 1

## 2023-06-20 MED ORDER — TAMSULOSIN HCL 0.4 MG PO CAPS
0.4000 mg | ORAL_CAPSULE | Freq: Once | ORAL | Status: AC
  Administered 2023-06-20: 0.4 mg via ORAL
  Filled 2023-06-20: qty 1

## 2023-06-20 MED ORDER — IOHEXOL 350 MG/ML SOLN
75.0000 mL | Freq: Once | INTRAVENOUS | Status: AC | PRN
  Administered 2023-06-20: 75 mL via INTRAVENOUS

## 2023-06-20 MED ORDER — OXYCODONE-ACETAMINOPHEN 5-325 MG PO TABS
2.0000 | ORAL_TABLET | Freq: Once | ORAL | Status: AC
  Administered 2023-06-20: 2 via ORAL
  Filled 2023-06-20: qty 2

## 2023-06-20 MED ORDER — SODIUM CHLORIDE 0.9 % IV BOLUS
1000.0000 mL | Freq: Once | INTRAVENOUS | Status: AC
  Administered 2023-06-20: 1000 mL via INTRAVENOUS

## 2023-06-20 NOTE — Discharge Instructions (Signed)
You are seen in the ER today for your testicular and lower abdominal pain.  You have a small kidney stone on the right side.  You have been prescribed medication to help with passage of the stone as well as pain medication.  Please call the urologist listed below this morning to schedule follow-up appointment.  You did have changes in your bladder which will need further evaluation with a scope, which will be discussed with you with the urologist.  Return to the ER with any new severe symptoms.

## 2023-06-20 NOTE — ED Triage Notes (Signed)
Pt seen for same yesterday.  Had [place] and CT scan.  Pt presents to triage writhing in pain, diaphoretic.

## 2023-06-20 NOTE — Discharge Instructions (Addendum)
Take tamsulosin and oxycodone as directed.  Ibuprofen may also help with discomfort

## 2023-06-20 NOTE — ED Provider Notes (Signed)
[place] EMERGENCY DEPARTMENT AT [facility] Provider Note   CSN: [id number] Arrival date & time: 06/19/23  2243     History  Chief Complaint  Patient presents with   Testicle Pain    Randy Cross is a 32 y.o. male who presents with concern for left testicular pain and pain with urination as well as hematuria.  Patient was seen  in triage in the emergency department for the same on 8/20 and had scrotal ultrasound with Doppler at that time which was normal with the exception of mild left varicocele, however he eloped from the department prior to evaluation by provider.  Presents today with persistence of pain.  Intermittent, describes it as "the string holding up my left ball sometimes just wraps around itself and hurts really bad".  Patient writhing around in pain in triage.  Level 5 caveat due to acuity of presentation upon arrival.   HPI     Home Medications Prior to Admission medications   Medication Sig Start Date End Date Taking? Authorizing Provider  oxyCODONE-acetaminophen (PERCOCET/ROXICET) 5-325 MG tablet Take 1 tablet by mouth every 6 (six) hours as needed for severe pain. 06/20/23  Yes Sakai Wolford, Eugene Gavia, PA-C  tamsulosin (FLOMAX) 0.4 MG CAPS capsule Take 1 capsule (0.4 mg total) by mouth daily. Until you have passed your kidney stone. 06/20/23  Yes Tylyn Stankovich, Eugene Gavia, PA-C      Allergies    Patient has no known allergies.    Review of Systems   Review of Systems  Physical Exam Updated Vital Signs BP (!) 122/94   Pulse 87   Temp 97.7 F (36.5 C) (Axillary)   Resp 19   SpO2 99%  Physical Exam  ED Results / Procedures / Treatments   Labs (all labs ordered are listed, but only abnormal results are displayed) Labs Reviewed  URINALYSIS, ROUTINE W REFLEX MICROSCOPIC - Abnormal; Notable for the following components:      Result Value   APPearance HAZY (*)    Hgb urine dipstick LARGE (*)    Protein, ur 30 (*)    All other components within  normal limits  BASIC METABOLIC PANEL - Abnormal; Notable for the following components:   Creatinine, Ser 1.35 (*)    All other components within normal limits  CBC    EKG None  Radiology CT ABDOMEN PELVIS W CONTRAST  Result Date: 06/20/2023 CLINICAL DATA:  Acute nonlocalized abdominal pain. Right testicular and groin pain. Normal scrotal ultrasound. EXAM: CT ABDOMEN AND PELVIS WITH CONTRAST TECHNIQUE: Multidetector CT imaging of the abdomen and pelvis was performed using the standard protocol following bolus administration of intravenous contrast. RADIATION DOSE REDUCTION: This exam was performed according to the departmental dose-optimization program which includes automated exposure control, adjustment of the mA and/or kV according to patient size and/or use of iterative reconstruction technique. CONTRAST:  75mL OMNIPAQUE IOHEXOL 350 MG/ML SOLN COMPARISON:  Scrotal ultrasound 06/20/2023 FINDINGS: Lower chest: No acute abnormality. Hepatobiliary: Unremarkable liver. Normal gallbladder. No biliary dilation. Pancreas: Unremarkable. Spleen: Unremarkable. Adrenals/Urinary Tract: Unremarkable adrenal glands. Low-attenuation lesions in the kidneys are statistically likely to represent cysts. No follow-up is required. Question 2 mm stone in the distal right ureter versus phlebolith (series 3/image 65). Irregular mass arising from the posterior bladder wall measuring 1.8 x 1.7 cm (series 3/image 69) may represent blood products. Neoplasm is not excluded. No hydronephrosis. Stomach/Bowel: Normal caliber large and small bowel. No bowel wall thickening. The appendix is normal.Stomach is within normal limits. Vascular/Lymphatic:  No significant vascular findings are present. No enlarged abdominal or pelvic lymph nodes. Reproductive: Unremarkable. Other: No free intraperitoneal fluid or air. Musculoskeletal: No acute fracture. IMPRESSION: 1. Question 2 mm stone in the distal right ureter versus phlebolith. No  hydronephrosis. 2. Irregular mass arising from the posterior bladder wall measuring 1.8 x 1.7 cm may represent blood products/clot. Neoplasm is not excluded. Recommend urology referral and further evaluation with cystoscopy. Electronically Signed   By: Minerva Fester M.D.   On: 06/20/2023 03:07   US SCROTUM W/DOPPLER  Result Date: 06/20/2023 CLINICAL DATA:  Testicle pain EXAM: SCROTAL ULTRASOUND DOPPLER ULTRASOUND OF THE TESTICLES TECHNIQUE: Complete ultrasound examination of the testicles, epididymis, and other scrotal structures was performed. Color and spectral Doppler ultrasound were also utilized to evaluate blood flow to the testicles. COMPARISON:  06/17/2023 FINDINGS: Right testicle Measurements: 4.8 x 2.6 x 3.3 cm. No mass or microlithiasis visualized. Left testicle Measurements: 4.5 x 2.5 x 3.8 cm. No mass or microlithiasis visualized. Right epididymis:  Normal in size and appearance. Left epididymis:  1.3 cm epididymal head cyst, stable. Hydrocele:  None visualized. Varicocele:  Mild left varicocele. Pulsed Doppler interrogation of both testes demonstrates normal low resistance arterial and venous waveforms bilaterally. IMPRESSION: No testicular abnormality or evidence of torsion or infection. No explanation for the patient's pain. Mild left varicocele. Electronically Signed   By: Charlett Nose M.D.   On: 06/20/2023 00:59    Procedures Procedures    Medications Ordered in ED Medications  fentaNYL (SUBLIMAZE) injection 50 mcg (50 mcg Intravenous Given 06/20/23 0008)  ondansetron (ZOFRAN) injection 4 mg (4 mg Intravenous Given 06/20/23 0007)  fentaNYL (SUBLIMAZE) injection 50 mcg (50 mcg Intravenous Given 06/20/23 0017)  HYDROmorphone (DILAUDID) injection 0.5 mg (0.5 mg Intravenous Given 06/20/23 0023)  HYDROmorphone (DILAUDID) injection 0.5 mg (0.5 mg Intravenous Given 06/20/23 0219)  iohexol (OMNIPAQUE) 350 MG/ML injection 75 mL (75 mLs Intravenous Contrast Given 06/20/23 0243)  HYDROmorphone  (DILAUDID) injection 0.5 mg (0.5 mg Intravenous Given 06/20/23 0349)  ketorolac (TORADOL) 15 MG/ML injection 15 mg (15 mg Intravenous Given 06/20/23 0423)    ED Course/ Medical Decision Making/ A&P Clinical Course as of 06/20/23 0552  Fri Jun 20, 2023  6295 Consult to urologist Dr. Cardell Peach, who has reviewed the patient's images.  No emergent urologic intervention required at this time.  Does state that if we are able to manage patient's pain he will be able to seen in the office today for consultation and to schedule scope for further characterization of intravesicular mass.  I appreciate his collaboration in the care of this patient. [RS]    Clinical Course User Index [RS] Saloni Lablanc, Eugene Gavia, PA-C                                Medical Decision Making 32 year old male with left testicular pain.  Hypertensive on intake, vital signs otherwise normal.  Cardiopulmonary exam normal, abdominal exam is benign.  GU exam as above.  DDx includes was not limited to testicular torsion, epididymitis, scrotal cellulitis/Fournier's gangrene, hematocele, hydrocele, varicocele, orchitis, scrotal abscess, spermatocele, testicular tumor, malignancy, inguinal hernia.   Amount and/or Complexity of Data Reviewed Labs: ordered.    Details:  CBC without leukocytosis or anemia, BMP with mild elevation creatinine to 1.35, UA with large amount of hemoglobin and protein, not concerning for infection. Radiology: ordered.    Details:   Scrotal ultrasound with Doppler was significant only for mild  left varicocele.  ct pending at this time. CT with regular mass in the posterior bladder possible blood clot versus neoplasm.  2 mm right distal ureteral stone without hydronephrosis.   Risk Prescription drug management.    Patient required multiple rounds of analgesia in the emergency department without resolution of his pain.  Consult to urology Dr. Cardell Peach as above.  Patient reevaluated now with significant improvement in  his pain.  Shared decision-making with patient regarding role for admission to the hospital for pain management versus discharge with pain prescription and outpatient follow-up with urology.  Patient voiced understanding of his treatment optionswas to proceed with discharge at this time.  Patient is well-appearing, tolerating p.o.  Normal GU exam with the presence of a chaperone Human resources officer.  Clinical concern for emergent underlying etiology for this patient's symptoms that would warrant further ED workup or inpatient management is exceedingly low.  Randy Cross  voiced understanding of his medical evaluation and treatment plan. Each of their questions answered to their expressed satisfaction.  Return precautions were given.  Patient is well-appearing, stable, and was discharged in good condition.  This chart was dictated using voice recognition software, Dragon. Despite the best efforts of this provider to proofread and correct errors, errors may still occur which can change documentation meaning.     Final Clinical Impression(s) / ED Diagnoses Final diagnoses:  Ureterolithiasis    Rx / DC Orders ED Discharge Orders          Ordered    tamsulosin (FLOMAX) 0.4 MG CAPS capsule  Daily        06/20/23 0503    oxyCODONE-acetaminophen (PERCOCET/ROXICET) 5-325 MG tablet  Every 6 hours PRN        06/20/23 0503              Ayeisha Lindenberger, Eugene Gavia, PA-C 06/20/23 0552    Sabas Sous, MD 06/20/23 [phone]

## 2023-06-20 NOTE — ED Provider Notes (Signed)
[place] EMERGENCY DEPARTMENT AT [facility] Provider Note   CSN: [id number] Arrival date & time: 06/20/23  1700     History  Chief Complaint  Patient presents with   Testicle Pain    Randy Cross is a 32 y.o. male.  Patient complains of severe pain in his left testicle.  Patient reports that he feels like he has something tearing inside of his left scrotum.  Patient was seen early this a.m. for the same.  He reports that the pain resolved but has now returned.  Patient did not fill prescription for pain medicine due to the cost.  He was seen on the 20th and had an ultrasound of his scrotum and seen yesterday and had a ultrasound and a CT.  CT scan showed a possible 2 mm stone in the right distal ureter as well as an area concerning for bladder mass.  Patient has been referred to urology.  The history is provided by the patient. No language interpreter was used.  Testicle Pain This is a new problem. The current episode started 1 to 2 hours ago. The problem occurs constantly. Nothing aggravates the symptoms. Nothing relieves the symptoms. He has tried nothing for the symptoms.       Home Medications Prior to Admission medications   Medication Sig Start Date End Date Taking? Authorizing Provider  oxyCODONE-acetaminophen (PERCOCET/ROXICET) 5-325 MG tablet Take 1 tablet by mouth every 6 (six) hours as needed for severe pain. Patient not taking: Reported on 06/20/2023 06/20/23   Sponseller, Eugene Gavia, PA-C  tamsulosin (FLOMAX) 0.4 MG CAPS capsule Take 1 capsule (0.4 mg total) by mouth daily. Until you have passed your kidney stone. Patient not taking: Reported on 06/20/2023 06/20/23   Paris Lore, PA-C      Allergies    Patient has no known allergies.    Review of Systems   Review of Systems  Genitourinary:  Positive for testicular pain.  All other systems reviewed and are negative.   Physical Exam Updated Vital Signs BP 131/83 (BP Location: Right Arm)    Pulse 80   Temp 98.8 F (37.1 C) (Oral)   Resp (!) 21   SpO2 100%  Physical Exam Vitals and nursing note reviewed.  Constitutional:      General: He is in acute distress.     Appearance: He is well-developed. He is diaphoretic.  HENT:     Head: Normocephalic and atraumatic.  Eyes:     Conjunctiva/sclera: Conjunctivae normal.  Cardiovascular:     Rate and Rhythm: Normal rate and regular rhythm.     Heart sounds: No murmur heard. Pulmonary:     Effort: Pulmonary effort is normal. No respiratory distress.     Breath sounds: Normal breath sounds.  Abdominal:     Palpations: Abdomen is soft.     Tenderness: There is no abdominal tenderness.  Genitourinary:    Penis: Normal.      Testes: Normal.  Musculoskeletal:        General: No swelling.  Skin:    General: Skin is warm.     Capillary Refill: Capillary refill takes less than 2 seconds.  Neurological:     Mental Status: He is alert.  Psychiatric:        Mood and Affect: Mood normal.     ED Results / Procedures / Treatments   Labs (all labs ordered are listed, but only abnormal results are displayed) Labs Reviewed  COMPREHENSIVE METABOLIC PANEL - Abnormal; Notable  for the following components:      Result Value   Glucose, Bld 119 (*)    Calcium 8.6 (*)    Total Protein 6.1 (*)    AST 52 (*)    ALT 65 (*)    All other components within normal limits  URINALYSIS, ROUTINE W REFLEX MICROSCOPIC - Abnormal; Notable for the following components:   APPearance CLOUDY (*)    Hgb urine dipstick MODERATE (*)    Protein, ur 30 (*)    Bacteria, UA RARE (*)    All other components within normal limits  CBC WITH DIFFERENTIAL/PLATELET    EKG None  Radiology US SCROTUM  Result Date: 06/20/2023 CLINICAL DATA:  Pain. EXAM: SCROTAL ULTRASOUND DOPPLER ULTRASOUND OF THE TESTICLES TECHNIQUE: Complete ultrasound examination of the testicles, epididymis, and other scrotal structures was performed. Color and spectral Doppler  ultrasound were also utilized to evaluate blood flow to the testicles. COMPARISON:  Scrotal ultrasound 20 hours ago.  Also 06/17/2023 FINDINGS: Right testicle Measurements: 5.1 x 3.1 x 3.1 cm. Homogeneous echogenicity. Normal blood flow. No mass or microlithiasis visualized. Left testicle Measurements: 5.2 x 3.1 x 3.1 cm. Homogeneous echogenicity. Normal blood flow. No mass or microlithiasis visualized. Right epididymis:  Normal in size and appearance. Left epididymis: Again seen 1.4 cm epididymal head cyst, with additional smaller adjacent epididymal head cysts. Otherwise normal in appearance. Hydrocele:  None visualized. Varicocele:  Present on the left. Pulsed Doppler interrogation of both testes demonstrates normal low resistance arterial and venous waveforms bilaterally. IMPRESSION: 1. Stable exam. Unchanged left epididymal head cyst and left varicocele. 2. Normal blood flow to both testis. No evidence of torsion or inflammation. Electronically Signed   By: Narda Rutherford M.D.   On: 06/20/2023 20:35   US PELVIC DOPPLER (TORSION R/O OR MASS ARTERIAL FLOW)  Result Date: 06/20/2023 CLINICAL DATA:  Pain. EXAM: SCROTAL ULTRASOUND DOPPLER ULTRASOUND OF THE TESTICLES TECHNIQUE: Complete ultrasound examination of the testicles, epididymis, and other scrotal structures was performed. Color and spectral Doppler ultrasound were also utilized to evaluate blood flow to the testicles. COMPARISON:  Scrotal ultrasound 20 hours ago.  Also 06/17/2023 FINDINGS: Right testicle Measurements: 5.1 x 3.1 x 3.1 cm. Homogeneous echogenicity. Normal blood flow. No mass or microlithiasis visualized. Left testicle Measurements: 5.2 x 3.1 x 3.1 cm. Homogeneous echogenicity. Normal blood flow. No mass or microlithiasis visualized. Right epididymis:  Normal in size and appearance. Left epididymis: Again seen 1.4 cm epididymal head cyst, with additional smaller adjacent epididymal head cysts. Otherwise normal in appearance. Hydrocele:   None visualized. Varicocele:  Present on the left. Pulsed Doppler interrogation of both testes demonstrates normal low resistance arterial and venous waveforms bilaterally. IMPRESSION: 1. Stable exam. Unchanged left epididymal head cyst and left varicocele. 2. Normal blood flow to both testis. No evidence of torsion or inflammation. Electronically Signed   By: Narda Rutherford M.D.   On: 06/20/2023 20:35   CT ABDOMEN PELVIS W CONTRAST  Result Date: 06/20/2023 CLINICAL DATA:  Acute nonlocalized abdominal pain. Right testicular and groin pain. Normal scrotal ultrasound. EXAM: CT ABDOMEN AND PELVIS WITH CONTRAST TECHNIQUE: Multidetector CT imaging of the abdomen and pelvis was performed using the standard protocol following bolus administration of intravenous contrast. RADIATION DOSE REDUCTION: This exam was performed according to the departmental dose-optimization program which includes automated exposure control, adjustment of the mA and/or kV according to patient size and/or use of iterative reconstruction technique. CONTRAST:  75mL OMNIPAQUE IOHEXOL 350 MG/ML SOLN COMPARISON:  Scrotal ultrasound 06/20/2023 FINDINGS:  Lower chest: No acute abnormality. Hepatobiliary: Unremarkable liver. Normal gallbladder. No biliary dilation. Pancreas: Unremarkable. Spleen: Unremarkable. Adrenals/Urinary Tract: Unremarkable adrenal glands. Low-attenuation lesions in the kidneys are statistically likely to represent cysts. No follow-up is required. Question 2 mm stone in the distal right ureter versus phlebolith (series 3/image 65). Irregular mass arising from the posterior bladder wall measuring 1.8 x 1.7 cm (series 3/image 69) may represent blood products. Neoplasm is not excluded. No hydronephrosis. Stomach/Bowel: Normal caliber large and small bowel. No bowel wall thickening. The appendix is normal.Stomach is within normal limits. Vascular/Lymphatic: No significant vascular findings are present. No enlarged abdominal or  pelvic lymph nodes. Reproductive: Unremarkable. Other: No free intraperitoneal fluid or air. Musculoskeletal: No acute fracture. IMPRESSION: 1. Question 2 mm stone in the distal right ureter versus phlebolith. No hydronephrosis. 2. Irregular mass arising from the posterior bladder wall measuring 1.8 x 1.7 cm may represent blood products/clot. Neoplasm is not excluded. Recommend urology referral and further evaluation with cystoscopy. Electronically Signed   By: Minerva Fester M.D.   On: 06/20/2023 03:07   US SCROTUM W/DOPPLER  Result Date: 06/20/2023 CLINICAL DATA:  Testicle pain EXAM: SCROTAL ULTRASOUND DOPPLER ULTRASOUND OF THE TESTICLES TECHNIQUE: Complete ultrasound examination of the testicles, epididymis, and other scrotal structures was performed. Color and spectral Doppler ultrasound were also utilized to evaluate blood flow to the testicles. COMPARISON:  06/17/2023 FINDINGS: Right testicle Measurements: 4.8 x 2.6 x 3.3 cm. No mass or microlithiasis visualized. Left testicle Measurements: 4.5 x 2.5 x 3.8 cm. No mass or microlithiasis visualized. Right epididymis:  Normal in size and appearance. Left epididymis:  1.3 cm epididymal head cyst, stable. Hydrocele:  None visualized. Varicocele:  Mild left varicocele. Pulsed Doppler interrogation of both testes demonstrates normal low resistance arterial and venous waveforms bilaterally. IMPRESSION: No testicular abnormality or evidence of torsion or infection. No explanation for the patient's pain. Mild left varicocele. Electronically Signed   By: Charlett Nose M.D.   On: 06/20/2023 00:59    Procedures Procedures    Medications Ordered in ED Medications  ketorolac (TORADOL) 30 MG/ML injection 30 mg (has no administration in time range)  tamsulosin (FLOMAX) capsule 0.4 mg (has no administration in time range)  oxyCODONE-acetaminophen (PERCOCET/ROXICET) 5-325 MG per tablet 2 tablet (has no administration in time range)  sodium chloride 0.9 % bolus  1,000 mL (1,000 mLs Intravenous Bolus 06/20/23 1819)  HYDROmorphone (DILAUDID) injection 1 mg (1 mg Intravenous Given 06/20/23 1819)  ondansetron (ZOFRAN) injection 4 mg (4 mg Intravenous Given 06/20/23 1820)    ED Course/ Medical Decision Making/ A&P                                 Medical Decision Making Patient complains of severe pain in his left scrotal area patient is sweating and writhing in pain  Amount and/or Complexity of Data Reviewed Independent Historian: spouse    Details: Is here with a family member who is supportive External Data Reviewed: radiology and notes.    Details: CT scan and previous ultrasound are reviewed.  ED provider notes are reviewed from previous visit Labs: ordered. Decision-making details documented in ED Course.    Details: UA shows greater than 50 red blood cells Radiology: ordered and independent interpretation performed. Decision-making details documented in ED Course.    Details: ReSound scrotum with Dopplers is repeated and is read by the radiologist as no acute torsion  Risk Prescription drug management.  Risk Details: Patient examined by me and Dr. Earlene Plater ED physician.  Patient has severe pain.  He has pain to palpation of his left scrotal area.  Patient is given IV pain medicines.  Repeat ultrasound is obtained to make sure that patient does not have a torsion and has not had an intermittent torsion that is causing his symptoms.  The scrotal ultrasound and Doppler showed no acute changes.  Patient has had pain relief with Dilaudid and Zofran.  He has not filled his medications for pain.  Patient is advised to at least fill the pain medication he is advised to try ibuprofen as well for the discomfort I counseled him on his symptoms he understands the necessity of following up with urology for concerns of bladder mass.  Patient is given 2 Percocet tamsulosin and Toradol IV here prior to discharge.  He is discharged in stable  condition           Final Clinical Impression(s) / ED Diagnoses Final diagnoses:  Left testicular pain  Renal colic    Rx / DC Orders ED Discharge Orders     None      An After Visit Summary was printed and given to the patient.    Osie Cheeks 06/20/23 2127    Laurence Spates, MD 06/20/23 [phone]

## 2023-06-20 NOTE — ED Notes (Signed)
Pt called for rooming x2. No response.

## 2023-06-23 ENCOUNTER — Other Ambulatory Visit: Payer: Self-pay

## 2023-06-23 ENCOUNTER — Emergency Department (HOSPITAL_BASED_OUTPATIENT_CLINIC_OR_DEPARTMENT_OTHER)
Admission: EM | Admit: 2023-06-23 | Discharge: 2023-06-24 | Disposition: A | Discharge status: Home / Self Care | Payer: Self-pay | Attending: Emergency Medicine | Admitting: Emergency Medicine

## 2023-06-23 DIAGNOSIS — R1031 Right lower quadrant pain: Secondary | ICD-10-CM | POA: Insufficient documentation

## 2023-06-23 NOTE — ED Triage Notes (Signed)
Pt states that he has had abdominal pain x 5 days and was diagnosed with kidney stone at [facility].

## 2023-06-24 ENCOUNTER — Encounter (HOSPITAL_COMMUNITY): Payer: Self-pay | Admitting: Urology

## 2023-06-24 ENCOUNTER — Other Ambulatory Visit: Payer: Self-pay | Admitting: Urology

## 2023-06-24 ENCOUNTER — Encounter (HOSPITAL_COMMUNITY)
Admission: EM | Disposition: A | Discharge status: Home / Self Care | Payer: Self-pay | Source: Ambulatory Visit | Attending: Urology

## 2023-06-24 ENCOUNTER — Ambulatory Visit (HOSPITAL_COMMUNITY)
Admission: EM | Admit: 2023-06-24 | Discharge: 2023-06-24 | Disposition: A | Discharge status: Home / Self Care | Payer: Self-pay | Source: Ambulatory Visit | Attending: Urology | Admitting: Urology

## 2023-06-24 ENCOUNTER — Ambulatory Visit (HOSPITAL_BASED_OUTPATIENT_CLINIC_OR_DEPARTMENT_OTHER): Payer: Self-pay | Admitting: Certified Registered"

## 2023-06-24 ENCOUNTER — Other Ambulatory Visit: Payer: Self-pay

## 2023-06-24 ENCOUNTER — Ambulatory Visit (HOSPITAL_COMMUNITY): Payer: Self-pay | Admitting: Certified Registered"

## 2023-06-24 ENCOUNTER — Ambulatory Visit (HOSPITAL_COMMUNITY): Payer: Self-pay

## 2023-06-24 DIAGNOSIS — N132 Hydronephrosis with renal and ureteral calculous obstruction: Secondary | ICD-10-CM | POA: Insufficient documentation

## 2023-06-24 DIAGNOSIS — F17210 Nicotine dependence, cigarettes, uncomplicated: Secondary | ICD-10-CM | POA: Insufficient documentation

## 2023-06-24 DIAGNOSIS — D090 Carcinoma in situ of bladder: Secondary | ICD-10-CM | POA: Insufficient documentation

## 2023-06-24 DIAGNOSIS — N3289 Other specified disorders of bladder: Secondary | ICD-10-CM

## 2023-06-24 DIAGNOSIS — N201 Calculus of ureter: Secondary | ICD-10-CM

## 2023-06-24 HISTORY — PX: TRANSURETHRAL RESECTION OF BLADDER TUMOR: SHX2575

## 2023-06-24 HISTORY — PX: CYSTOSCOPY WITH RETROGRADE PYELOGRAM, URETEROSCOPY AND STENT PLACEMENT: SHX5789

## 2023-06-24 SURGERY — CYSTOURETEROSCOPY, WITH RETROGRADE PYELOGRAM AND STENT INSERTION
Anesthesia: General | Site: Ureter

## 2023-06-24 MED ORDER — CHLORHEXIDINE GLUCONATE 0.12 % MT SOLN
15.0000 mL | Freq: Once | OROMUCOSAL | Status: AC
  Administered 2023-06-24: 15 mL via OROMUCOSAL

## 2023-06-24 MED ORDER — OXYCODONE-ACETAMINOPHEN 5-325 MG PO TABS: 1.0000 | ORAL_TABLET | Freq: Four times a day (QID) | ORAL | 0 refills | Status: DC | PRN

## 2023-06-24 MED ORDER — ACETAMINOPHEN 10 MG/ML IV SOLN
1000.0000 mg | Freq: Four times a day (QID) | INTRAVENOUS | Status: DC
  Administered 2023-06-24: 1000 mg via INTRAVENOUS
  Filled 2023-06-24: qty 100

## 2023-06-24 MED ORDER — EPHEDRINE SULFATE (PRESSORS) 50 MG/ML IJ SOLN
INTRAMUSCULAR | Status: DC | PRN
Start: 2023-06-24 — End: 2023-06-24
  Administered 2023-06-24 (×5): 10 mg via INTRAVENOUS

## 2023-06-24 MED ORDER — PROPOFOL 10 MG/ML IV BOLUS
INTRAVENOUS | Status: AC
  Filled 2023-06-24: qty 20

## 2023-06-24 MED ORDER — LIDOCAINE HCL (PF) 2 % IJ SOLN
INTRAMUSCULAR | Status: AC
  Filled 2023-06-24: qty 5

## 2023-06-24 MED ORDER — PHENYLEPHRINE HCL (PRESSORS) 10 MG/ML IV SOLN
INTRAVENOUS | Status: DC | PRN
  Administered 2023-06-24 (×2): 160 ug via INTRAVENOUS

## 2023-06-24 MED ORDER — LIDOCAINE 2% (20 MG/ML) 5 ML SYRINGE
INTRAMUSCULAR | Status: DC | PRN
  Administered 2023-06-24: 100 mg via INTRAVENOUS

## 2023-06-24 MED ORDER — TAMSULOSIN HCL 0.4 MG PO CAPS: 0.4000 mg | ORAL_CAPSULE | Freq: Every day | ORAL | 0 refills | Status: DC

## 2023-06-24 MED ORDER — PHENYLEPHRINE 80 MCG/ML (10ML) SYRINGE FOR IV PUSH (FOR BLOOD PRESSURE SUPPORT)
PREFILLED_SYRINGE | INTRAVENOUS | Status: AC
  Filled 2023-06-24: qty 30

## 2023-06-24 MED ORDER — PHENYLEPHRINE 80 MCG/ML (10ML) SYRINGE FOR IV PUSH (FOR BLOOD PRESSURE SUPPORT)
PREFILLED_SYRINGE | INTRAVENOUS | Status: AC
  Filled 2023-06-24: qty 10

## 2023-06-24 MED ORDER — MIDAZOLAM HCL 2 MG/2ML IJ SOLN
INTRAMUSCULAR | Status: AC
  Filled 2023-06-24: qty 2

## 2023-06-24 MED ORDER — GENTAMICIN SULFATE 40 MG/ML IJ SOLN
5.0000 mg/kg | INTRAVENOUS | Status: AC
  Administered 2023-06-24: 350 mg via INTRAVENOUS
  Filled 2023-06-24: qty 8.75

## 2023-06-24 MED ORDER — ACETAMINOPHEN 10 MG/ML IV SOLN: 1000.0000 mg | Freq: Once | INTRAVENOUS | Status: DC | PRN

## 2023-06-24 MED ORDER — CEPHALEXIN 500 MG PO CAPS: 500.0000 mg | ORAL_CAPSULE | Freq: Two times a day (BID) | ORAL | 0 refills | Status: DC

## 2023-06-24 MED ORDER — IOHEXOL 300 MG/ML  SOLN
INTRAMUSCULAR | Status: DC | PRN
  Administered 2023-06-24: 15 mL

## 2023-06-24 MED ORDER — EPHEDRINE 5 MG/ML INJ
INTRAVENOUS | Status: AC
  Filled 2023-06-24: qty 5

## 2023-06-24 MED ORDER — OXYCODONE-ACETAMINOPHEN 5-325 MG PO TABS
2.0000 | ORAL_TABLET | Freq: Once | ORAL | Status: AC
  Administered 2023-06-24: 2 via ORAL
  Filled 2023-06-24: qty 2

## 2023-06-24 MED ORDER — OXYCODONE HCL 5 MG/5ML PO SOLN: 5.0000 mg | Freq: Once | ORAL | Status: AC | PRN

## 2023-06-24 MED ORDER — FAMOTIDINE IN NACL 20-0.9 MG/50ML-% IV SOLN
20.0000 mg | Freq: Once | INTRAVENOUS | Status: AC
  Administered 2023-06-24: 20 mg via INTRAVENOUS
  Filled 2023-06-24: qty 50

## 2023-06-24 MED ORDER — HYDROMORPHONE HCL 1 MG/ML IJ SOLN
2.0000 mg | Freq: Once | INTRAMUSCULAR | Status: DC
  Filled 2023-06-24: qty 2

## 2023-06-24 MED ORDER — DEXAMETHASONE SODIUM PHOSPHATE 10 MG/ML IJ SOLN
INTRAMUSCULAR | Status: AC
  Filled 2023-06-24: qty 7

## 2023-06-24 MED ORDER — ONDANSETRON HCL 4 MG/2ML IJ SOLN: 4.0000 mg | Freq: Once | INTRAMUSCULAR | Status: DC | PRN

## 2023-06-24 MED ORDER — FENTANYL CITRATE PF 50 MCG/ML IJ SOSY
PREFILLED_SYRINGE | INTRAMUSCULAR | Status: AC
  Administered 2023-06-24: 50 ug via INTRAVENOUS
  Filled 2023-06-24: qty 2

## 2023-06-24 MED ORDER — MIDAZOLAM HCL 2 MG/2ML IJ SOLN
1.0000 mg | INTRAMUSCULAR | Status: AC
  Administered 2023-06-24 (×2): 1 mg via INTRAVENOUS
  Filled 2023-06-24: qty 2

## 2023-06-24 MED ORDER — SOD CITRATE-CITRIC ACID 500-334 MG/5ML PO SOLN
30.0000 mL | Freq: Once | ORAL | Status: AC
  Administered 2023-06-24: 30 mL via ORAL
  Filled 2023-06-24: qty 30

## 2023-06-24 MED ORDER — MIDAZOLAM HCL 5 MG/5ML IJ SOLN
INTRAMUSCULAR | Status: DC | PRN
  Administered 2023-06-24: 2 mg via INTRAVENOUS

## 2023-06-24 MED ORDER — OXYCODONE HCL 5 MG PO TABS
ORAL_TABLET | ORAL | Status: AC
  Filled 2023-06-24: qty 1

## 2023-06-24 MED ORDER — DEXAMETHASONE SODIUM PHOSPHATE 4 MG/ML IJ SOLN
INTRAMUSCULAR | Status: DC | PRN
  Administered 2023-06-24: 4 mg via INTRAVENOUS

## 2023-06-24 MED ORDER — ONDANSETRON HCL 4 MG/2ML IJ SOLN
INTRAMUSCULAR | Status: DC | PRN
  Administered 2023-06-24: 4 mg via INTRAVENOUS

## 2023-06-24 MED ORDER — FENTANYL CITRATE (PF) 100 MCG/2ML IJ SOLN
INTRAMUSCULAR | Status: DC | PRN
  Administered 2023-06-24: 25 ug via INTRAVENOUS

## 2023-06-24 MED ORDER — LACTATED RINGERS IV SOLN: INTRAVENOUS | Status: DC

## 2023-06-24 MED ORDER — HYDROMORPHONE HCL 1 MG/ML IJ SOLN
0.5000 mg | INTRAMUSCULAR | Status: DC
  Administered 2023-06-24 (×2): 0.5 mg via INTRAVENOUS
  Filled 2023-06-24: qty 1

## 2023-06-24 MED ORDER — PROPOFOL 10 MG/ML IV BOLUS
INTRAVENOUS | Status: DC | PRN
  Administered 2023-06-24: 160 mg via INTRAVENOUS

## 2023-06-24 MED ORDER — LIDOCAINE HCL (PF) 2 % IJ SOLN
INTRAMUSCULAR | Status: AC
  Filled 2023-06-24: qty 25

## 2023-06-24 MED ORDER — 0.9 % SODIUM CHLORIDE (POUR BTL) OPTIME
TOPICAL | Status: DC | PRN
  Administered 2023-06-24: 1000 mL

## 2023-06-24 MED ORDER — SENNOSIDES-DOCUSATE SODIUM 8.6-50 MG PO TABS: 1.0000 | ORAL_TABLET | Freq: Two times a day (BID) | ORAL | 0 refills | Status: AC

## 2023-06-24 MED ORDER — ONDANSETRON HCL 4 MG/2ML IJ SOLN
INTRAMUSCULAR | Status: AC
  Filled 2023-06-24: qty 12

## 2023-06-24 MED ORDER — KETOROLAC TROMETHAMINE 60 MG/2ML IM SOLN
60.0000 mg | Freq: Once | INTRAMUSCULAR | Status: DC
  Filled 2023-06-24: qty 2

## 2023-06-24 MED ORDER — FENTANYL CITRATE PF 50 MCG/ML IJ SOSY
PREFILLED_SYRINGE | INTRAMUSCULAR | Status: AC
  Administered 2023-06-24: 50 ug via INTRAVENOUS
  Filled 2023-06-24: qty 1

## 2023-06-24 MED ORDER — FENTANYL CITRATE (PF) 100 MCG/2ML IJ SOLN
INTRAMUSCULAR | Status: AC
  Filled 2023-06-24: qty 2

## 2023-06-24 MED ORDER — TAMSULOSIN HCL 0.4 MG PO CAPS
0.4000 mg | ORAL_CAPSULE | Freq: Once | ORAL | Status: AC
  Administered 2023-06-24: 0.4 mg via ORAL
  Filled 2023-06-24: qty 1

## 2023-06-24 MED ORDER — SODIUM CHLORIDE 0.9 % IR SOLN
Status: DC | PRN
  Administered 2023-06-24: 6000 mL

## 2023-06-24 MED ORDER — OXYCODONE HCL 5 MG PO TABS
5.0000 mg | ORAL_TABLET | Freq: Once | ORAL | Status: AC | PRN
  Administered 2023-06-24: 5 mg via ORAL

## 2023-06-24 MED ORDER — ORAL CARE MOUTH RINSE: 15.0000 mL | Freq: Once | OROMUCOSAL | Status: AC

## 2023-06-24 MED ORDER — KETOROLAC TROMETHAMINE 10 MG PO TABS: 10.0000 mg | ORAL_TABLET | Freq: Four times a day (QID) | ORAL | 0 refills | Status: DC | PRN

## 2023-06-24 MED ORDER — FENTANYL CITRATE PF 50 MCG/ML IJ SOSY
25.0000 ug | PREFILLED_SYRINGE | INTRAMUSCULAR | Status: DC | PRN
  Administered 2023-06-24: 50 ug via INTRAVENOUS

## 2023-06-24 SURGICAL SUPPLY — 31 items
BAG DRN RND TRDRP ANRFLXCHMBR (UROLOGICAL SUPPLIES)
BAG URINE DRAIN 2000ML AR STRL (UROLOGICAL SUPPLIES) IMPLANT
BAG URO CATCHER STRL LF (MISCELLANEOUS) ×3 IMPLANT
BASKET LASER NITINOL 1.9FR (BASKET) ×1 IMPLANT
BSKT STON RTRVL 120 1.9FR (BASKET) ×3
CATH URETL OPEN END 6FR 70 (CATHETERS) ×3 IMPLANT
CLOTH BEACON ORANGE TIMEOUT ST (SAFETY) ×3 IMPLANT
DRAPE FOOT SWITCH (DRAPES) ×3 IMPLANT
ELECT REM PT RETURN 15FT ADLT (MISCELLANEOUS) ×3 IMPLANT
EVACUATOR MICROVAS BLADDER (UROLOGICAL SUPPLIES) IMPLANT
EXTRACTOR STONE 1.7FRX115CM (UROLOGICAL SUPPLIES) IMPLANT
GLOVE SURG LX STRL 7.5 STRW (GLOVE) ×3 IMPLANT
GOWN STRL REUS W/ TWL XL LVL3 (GOWN DISPOSABLE) ×5 IMPLANT
GOWN STRL REUS W/TWL XL LVL3 (GOWN DISPOSABLE) ×9
GUIDEWIRE ANG ZIPWIRE 038X150 (WIRE) ×3 IMPLANT
GUIDEWIRE STR DUAL SENSOR (WIRE) ×3 IMPLANT
KIT TURNOVER KIT A (KITS) IMPLANT
LASER FIB FLEXIVA PULSE ID 365 (Laser) IMPLANT
LOOP CUT BIPOLAR 24F LRG (ELECTROSURGICAL) ×1 IMPLANT
MANIFOLD NEPTUNE II (INSTRUMENTS) ×3 IMPLANT
PACK CYSTO (CUSTOM PROCEDURE TRAY) ×3 IMPLANT
SHEATH NAVIGATOR HD 11/13X28 (SHEATH) IMPLANT
SHEATH NAVIGATOR HD 11/13X36 (SHEATH) ×1 IMPLANT
STENT POLARIS 5FRX24 (STENTS) ×1 IMPLANT
SYR TOOMEY IRRIG 70ML (MISCELLANEOUS) ×3
SYRINGE TOOMEY IRRIG 70ML (MISCELLANEOUS) ×1 IMPLANT
TRACTIP FLEXIVA PULS ID 200XHI (Laser) IMPLANT
TRACTIP FLEXIVA PULSE ID 200 (Laser)
TUBE PU 8FR 16IN ENFIT (TUBING) ×3 IMPLANT
TUBING CONNECTING 10 (TUBING) ×3 IMPLANT
TUBING UROLOGY SET (TUBING) ×3 IMPLANT

## 2023-06-24 NOTE — Progress Notes (Signed)
Agree with below note written by Estill Bakes, RN. Administration of med witnessed given to patient as per order. Pyxis verified and discrepancy corrected.  undocumented waste in pyxis for dilaudid 0.5 mg  Total dose of dilaudid (1mg ) was given to patient in 0.5mg  increments  UNDOCUMENTED WASTE Not needed resolved with Richardean Sale RN in [place].   This was done based on pharmacy recommendation to correct

## 2023-06-24 NOTE — H&P (Signed)
Randy Cross is an 32 y.o. male.    Chief Complaint: Pre-Op Cysto, retrogrades RIGHT ureteroscopic stone manipulation and Transurethral Resection of Bladder Tumor  HPI:   1 - Rt Distal Ureteral Stone - solitary 3mm fusiform Rt distal stone on ER CT 05/2023 on eval abd-groin pain. Given trial os medical therapy with alpha blocker.   2 - Rule Out Bladder Cancer - enhancing 2cm lesion Rt trigone area on ER CT 05/2023. 12PY smoker. Cr 1.0   3 - Non-Complex Left Renal Cyst - 2.5cm left upper cyst w/o mass effect or complex features incidetnal on contrast CT 05/2023.   PMH sig for upper abdominal exploration after trauma to remove foreitgn body (supra-umbilical midline, no bowel resection per patient). 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 No PCP   Today " Randy Cross " is seen to proceed with ureteroscopy and TURBT for ureteral stone and bladder mass.   History reviewed. No pertinent past medical history.  Past Surgical History:  Procedure Laterality Date   ABDOMINAL SURGERY      History reviewed. No pertinent family history. Social History:  reports that he has been smoking cigarettes. He has never used smokeless tobacco. He reports that he does not currently use alcohol. He reports that he does not currently use drugs after having used the following drugs: Marijuana and Cocaine.  Allergies: No Known Allergies  Medications Prior to Admission  Medication Sig Dispense Refill   oxyCODONE-acetaminophen (PERCOCET) 5-325 MG tablet Take 1-2 tablets by mouth every 6 (six) hours as needed. 12 tablet 0   tamsulosin (FLOMAX) 0.4 MG CAPS capsule Take 1 capsule (0.4 mg total) by mouth daily. 10 capsule 0    No results found for this or any previous visit (from the past 48 hour(s)). No results found.  Review of Systems  Constitutional:  Negative for chills and fever.  Genitourinary:  Positive for flank pain and hematuria.  All other systems reviewed  and are negative.   Blood pressure (!) 141/91, pulse 74, temperature 98.5 F (36.9 C), temperature source Oral, resp. rate 14, height 5\' 11"  (1.803 m), weight 70.3 kg, SpO2 99%. Physical Exam Vitals reviewed.  Constitutional:      Comments: In visibile colic pain  HENT:     Head: Normocephalic.  Eyes:     Pupils: Pupils are equal, round, and reactive to light.  Cardiovascular:     Rate and Rhythm: Normal rate.  Pulmonary:     Effort: Pulmonary effort is normal.  Abdominal:     General: Abdomen is flat.  Genitourinary:    Comments: Moderate Rt CVAT Musculoskeletal:        General: Normal range of motion.     Cervical back: Normal range of motion.  Skin:    General: Skin is warm.  Neurological:     General: No focal deficit present.     Mental Status: He is alert.  Psychiatric:        Mood and Affect: Mood normal.      Assessment/Plan  Proceed as planned with cysto, retrotgrades, ureteroscopy, TURBT. Risks, benefits, alternatives, peri-op course discussed.   Loletta Parish., MD 06/24/2023, 2:42 PM

## 2023-06-24 NOTE — ED Provider Notes (Signed)
[place] EMERGENCY DEPARTMENT AT [facility] Provider Note   CSN: [id number] Arrival date & time: 06/23/23  2342     History  Chief Complaint  Patient presents with   Abdominal Pain    Randy Cross is a 32 y.o. male.  Patient is a 32 year old male presenting with complaints of pain in his right lower quadrant radiating into his right testicle.  This is his fifth visit in the past week with similar complaints.  He has undergone 3 ultrasound and 1 CAT scan, however no definitive cause has been found.  CT scan mentions a possible calcification near the UVJ, but no hydronephrosis was noted.  There is also the mention of some thickening of his posterior bladder wall for which a cystoscopy was recommended.  I am uncertain as to whether or not he has an appointment set up with urology, but efforts have been made for this by the previous providers.  The history is provided by the patient.       Home Medications Prior to Admission medications   Medication Sig Start Date End Date Taking? Authorizing Provider  oxyCODONE-acetaminophen (PERCOCET/ROXICET) 5-325 MG tablet Take 1 tablet by mouth every 6 (six) hours as needed for severe pain. 06/20/23   Laurence Spates, MD  tamsulosin (FLOMAX) 0.4 MG CAPS capsule Take 1 capsule (0.4 mg total) by mouth daily. Until you have passed your kidney stone. 06/20/23   Laurence Spates, MD      Allergies    Patient has no known allergies.    Review of Systems   Review of Systems  All other systems reviewed and are negative.   Physical Exam Updated Vital Signs BP (!) 143/107 (BP Location: Right Arm)   Pulse (!) 109   Temp 98.2 F (36.8 C)   Resp (!) 22   SpO2 97%  Physical Exam Vitals and nursing note reviewed.  Constitutional:      General: He is not in acute distress.    Appearance: He is well-developed. He is not diaphoretic.     Comments: Patient is awake and alert.  He is writhing in the exam room and clutching his right  lower abdomen.  HENT:     Head: Normocephalic and atraumatic.  Cardiovascular:     Rate and Rhythm: Normal rate and regular rhythm.     Heart sounds: No murmur heard.    No friction rub.  Pulmonary:     Effort: Pulmonary effort is normal. No respiratory distress.     Breath sounds: Normal breath sounds. No wheezing or rales.  Abdominal:     General: Bowel sounds are normal. There is no distension.     Palpations: Abdomen is soft.     Tenderness: There is abdominal tenderness in the right lower quadrant. There is no right CVA tenderness or left CVA tenderness.  Musculoskeletal:        General: Normal range of motion.     Cervical back: Normal range of motion and neck supple.  Skin:    General: Skin is warm and dry.  Neurological:     Mental Status: He is alert and oriented to person, place, and time.     Coordination: Coordination normal.     ED Results / Procedures / Treatments   Labs (all labs ordered are listed, but only abnormal results are displayed) Labs Reviewed - No data to display  EKG None  Radiology No results found.  Procedures Procedures    Medications Ordered in ED  Medications  HYDROmorphone (DILAUDID) injection 2 mg (has no administration in time range)  ketorolac (TORADOL) injection 60 mg (has no administration in time range)    ED Course/ Medical Decision Making/ A&P  Patient presenting with complaints of right lower quadrant pain.  He has been experiencing this for the past week and has had multiple visits and imaging studies, but no definitive cause has been found.  He does have the suggestion of possibly a kidney stone at the UVJ, but I tend to believe this is likely a phlebolith.  There is no obstructive uropathy and no hydronephrosis.  Ultrasounds of also failed to reveal torsion or other abnormality.  Patient returns for the fifth time this week stating that his pain is not improving.  I see no indication for further imaging studies at this  point.  I had offered to administer IM Dilaudid along with Toradol for his pain, however he has declined this medication.  He is requesting that his Flomax be refilled.  He will be discharged with Flomax and I will refill a few more Percocet.  He has to follow-up with urology as previously recommended.  Final Clinical Impression(s) / ED Diagnoses Final diagnoses:  None    Rx / DC Orders ED Discharge Orders     None         Geoffery Lyons, MD 06/24/23 [phone]

## 2023-06-24 NOTE — Discharge Instructions (Signed)
1 - You may have urinary urgency (bladder spasms) and bloody urine on / off with stent in place. This is normal. ° °2 - Call MD or go to ER for fever >102, severe pain / nausea / vomiting not relieved by medications, or acute change in medical status ° °

## 2023-06-24 NOTE — Progress Notes (Signed)
undocumented waste in pyxis for dilaudid 0.5 mg  Total dose of dilaudid (1mg ) was given to patient in 0.5mg  increments  UNDOCUMENTED WASTE Not needed resolved with Richardean Sale RN in [place].   This was done based on pharmacy recommendation to correct

## 2023-06-24 NOTE — Op Note (Signed)
NAME: Randy Cross, Randy Cross MEDICAL RECORD NO: [id number] ACCOUNT NO: [id number] DATE OF BIRTH: November 14, 1990 FACILITY: [facility] LOCATION: [facility] PHYSICIAN: Sebastian Ache, MD  Operative Report   SURGEON:  Sebastian Ache, MD  PREOPERATIVE DIAGNOSES:  Right ureteral stone and bladder neoplasm.  POSTOPERATIVE DIAGNOSES:  Right ureteral stone and bladder cancer.  PROCEDURE PERFORMED: 1.  Cystoscopy with bilateral retrograde pyelograms interpretation. 2.  Right ureteroscopy, basketing of stone. 3.  Transurethral resection of bladder tumor, volume medium. 4.  Placement of right ureteral stent.  ESTIMATED BLOOD LOSS:  Nil.  COMPLICATIONS:  None.  SPECIMENS: 1.  Right ureteral stone fragments for composition analysis. 2.  Bladder tumor. 3.  Base of bladder tumor.  FINDINGS: 1.  Approximately 4 cm size papillary bladder tumor with stalk just superomedial to the right ureteral orifice. 2.  Small distal ureteral stone. 3.  Complete resolution of all accessible stone fragments larger than one-third mm on the right side following basket extraction. 4.  Placement of right ureteral stent, proximal end in the renal pelvis, distal in urinary bladder.  No tether.  INDICATIONS:  The patient is a 32 year old man who was found on workup of colicky flank pain and nausea to have a very small right distal ureteral stone with some mild hydronephrosis, but also a questionable vascular neoplasm of the bladder.  He does  admit to some hematuria.  He does have a significant smoking history despite his young age.  Options were discussed for management including recommended path of ureteroscopy to manage his stone with concomitant transurethral resection of bladder tumor  and he wished to proceed.  Informed consent was obtained and placed in medical record.  PROCEDURE IN DETAIL:  The patient being verified and procedure being cystoscopy, retrograde, right ureteroscopic stone manipulation and transurethral resection of  bladder tumor was confirmed.  Procedure timeout was performed.  Intravenous antibiotics  were administered.  General LMA anesthesia was induced.  The patient was placed into a low lithotomy position.  Sterile field was created, prepped and draped the patient's penis, perineum, and proximal thighs using iodine.  Cystourethroscopy was  performed using 21-French rigid cystoscope with offset lens.  Inspection of anterior and posterior urethra was unremarkable.  Inspection of urinary bladder revealed approximately 4 cm size papillary tumor that appeared to be on a relatively small stalk  emanating from a position approximately 1 cm superomedial to the right ureteral orifice.  No additional abnormalities were noted.  Ureteral orifices appeared single.  The left ureteral orifice was cannulated with a 6-French open-ended catheter, and a  left retrograde pyelogram was obtained.  Left retrograde pyelogram demonstrated a single left ureter, single system left kidney.  No filling defects or narrowing noted.  Next, right retrograde pyelogram was obtained.  Right retrograde pyelogram demonstrated a single right ureter, single system right kidney.  There was mild hydronephrosis noted. At this point, there were no obvious filling defects noted.  A 0.038 ZIPwire was advanced to the level of the upper pole set  aside as a safety wire.  An 8-French feeding tube placed in the urinary bladder for pressure release and semirigid ureteroscopy was performed of the entire length right ureter alongside a separate sensor working wire.  No mucosal abnormalities were  found.  Stone in question was not identified.  However, the ureteral caliber was larger than would be expected otherwise, likely reflecting recent hydronephrosis and possibly recently passed stone versus in situ stone retrograde positioned.  As the goal  today was to verify stone free,  the semirigid scope was exchanged for a medium length ureteral access sheath placed  over the proximal ureter using continuous fluoroscopic guidance and flexible digital ureteroscopy was performed of the proximal right  ureter and systemic inspection of the right kidney, including all calices x3.  The prior right ureteral stone was indeed retrograde positioned into the kidney.  It was noted to be free floating in upper mid pole calix.  It appeared to be amenable to  simple basketing.  It was basketed with the Escape basket, removed and set aside for composition analysis.  Following this, complete resolution of all accessible stone fragments larger than one-third mm right *** kidney, access sheath was removed under  continuous vision. No significant mucosal abnormalities were found.  Attention was directed at resection of bladder tumor.  Cystoscope was exchanged for the 26-French resectoscope sheath with visual obturator and using medium size resectoscope loop, a  very careful resection was performed of the papillary tumor down to its stalk, which was very carefully resected and this was quite close to the right ureteral orifice. Bladder tumor fragments were irrigated and set aside for permanent pathology, labeled  as such.  Next, cold cup biopsy forceps were used to very carefully obtain representative deep seromuscular bites.  This set aside, labeled as base of bladder tumor and the loop was once again used to fulgurate the base of this and taking exquisite care  to avoid any injury directly to the orifice itself.  This resulted in excellent hemostasis, there was no evidence of any perforation.  Given the very close proximity of the tumor to the ureteral orifice, it was felt that interval stenting with  nontethered stent would be most prudent.  As such, a new 5 x 24 Polaris type stent was carefully placed using fluoroscopic guidance.  Good proximal and distal plane were noted.  Procedure was terminated.  The patient tolerated procedure well, no  immediate periprocedural complications.  The  patient was taken to postanesthesia care unit in stable condition.  Plan for discharge home.   NIK D: 06/24/2023 5:39:45 pm T: 06/24/2023 8:55:00 pm  JOB: [id number]/ [id number]

## 2023-06-24 NOTE — Transfer of Care (Signed)
Immediate Anesthesia Transfer of Care Note  Patient: Randy Cross  Procedure(s) Performed: Procedure(s): CYSTOSCOPY WITH BILATERAL RETROGRADE PYELOGRAM, URETEROSCOPY , BASKETRY OF STONE AND STENT PLACEMENT (N/A) TRANSURETHRAL RESECTION OF BLADDER TUMOR (TURBT) (N/A)  Patient Location: PACU  Anesthesia Type:General  Level of Consciousness:  sedated, patient cooperative and responds to stimulation  Airway & Oxygen Therapy:Patient Spontanous Breathing and Patient connected to face mask oxgen  Post-op Assessment:  Report given to PACU RN and Post -op Vital signs reviewed and stable  Post vital signs:  Reviewed and stable  Last Vitals:  Vitals:   06/24/23 1410 06/24/23 1415  BP: 133/83 134/80  Pulse: 64 67  Resp: 17 18  Temp:    SpO2: 100% 100%    Complications: No apparent anesthesia complications

## 2023-06-24 NOTE — Discharge Instructions (Signed)
Begin taking Flomax as prescribed.  Begin taking Percocet as prescribed as needed pain.  Follow-up with urology as previously recommended.

## 2023-06-24 NOTE — Anesthesia Procedure Notes (Signed)
Procedure Name: LMA Insertion Date/Time: 06/24/2023 4:33 PM  Performed by: Nelle Don, CRNAPre-anesthesia Checklist: Patient identified, Emergency Drugs available, Suction available and Patient being monitored Patient Re-evaluated:Patient Re-evaluated prior to induction Oxygen Delivery Method: Circle system utilized Preoxygenation: Pre-oxygenation with 100% oxygen Induction Type: IV induction LMA: LMA with gastric port inserted LMA Size: 4.0 Number of attempts: 1 Dental Injury: Teeth and Oropharynx as per pre-operative assessment

## 2023-06-24 NOTE — Anesthesia Preprocedure Evaluation (Signed)
Anesthesia Evaluation  Patient identified by MRN, date of birth, ID band Patient awake    Reviewed: Allergy & Precautions, NPO status , Patient's Chart, lab work & pertinent test results, reviewed documented beta blocker date and time   History of Anesthesia Complications Negative for: history of anesthetic complications  Airway Mallampati: II  TM Distance: >3 FB     Dental no notable dental hx.    Pulmonary neg COPD, Current Smoker and Patient abstained from smoking.   breath sounds clear to auscultation       Cardiovascular (-) hypertension(-) CAD, (-) Past MI, (-) Cardiac Stents and (-) CABG  Rhythm:Regular Rate:Normal     Neuro/Psych neg Headaches, neg Seizures    GI/Hepatic ,neg GERD  ,,(+) neg Cirrhosis        Endo/Other    Renal/GU Renal diseaseUreteral stone, ? Bladder CA     Musculoskeletal   Abdominal   Peds  Hematology   Anesthesia Other Findings   Reproductive/Obstetrics                              Anesthesia Physical Anesthesia Plan  ASA: 2  Anesthesia Plan: General   Post-op Pain Management:    Induction: Intravenous  PONV Risk Score and Plan: 1  Airway Management Planned: LMA  Additional Equipment:   Intra-op Plan:   Post-operative Plan: Extubation in OR  Informed Consent: I have reviewed the patients History and Physical, chart, labs and discussed the procedure including the risks, benefits and alternatives for the proposed anesthesia with the patient or authorized representative who has indicated his/her understanding and acceptance.     Dental advisory given  Plan Discussed with: CRNA  Anesthesia Plan Comments:          Anesthesia Quick Evaluation

## 2023-06-24 NOTE — ED Notes (Signed)
Pt verbalized understanding of d/c instructions, meds, and followup care. Denies questions. VSS, no distress noted. Steady gait to exit with all belongings.  ?

## 2023-06-24 NOTE — Brief Op Note (Signed)
06/24/2023  5:33 PM  PATIENT:  Randy Cross  32 y.o. male  PRE-OPERATIVE DIAGNOSIS:  URETERAL STONE AND BLADDER MASS  POST-OPERATIVE DIAGNOSIS:  URETERAL STONE AND BLADDER MASS  PROCEDURE:  Procedure(s): CYSTOSCOPY WITH BILATERAL RETROGRADE PYELOGRAM, URETEROSCOPY , BASKETRY OF STONE AND STENT PLACEMENT (N/A) TRANSURETHRAL RESECTION OF BLADDER TUMOR (TURBT) (N/A)  SURGEON:  Surgeons and Role:    * Riggin Cuttino, Delbert Phenix., MD - Primary  PHYSICIAN ASSISTANT:   ASSISTANTS: none   ANESTHESIA:   general  EBL:  minimal   BLOOD ADMINISTERED:none  DRAINS: none   LOCAL MEDICATIONS USED:  NONE  SPECIMEN:  Source of Specimen:  1- Rt ureteral stone; 2 - bladder tumor; 3 - base of bladder tumor  DISPOSITION OF SPECIMEN:  PATHOLOGY  COUNTS:  YES  TOURNIQUET:  * No tourniquets in log *  DICTATION: .Other Dictation: Dictation Number   [id number]  PLAN OF CARE: Discharge to home after PACU  PATIENT DISPOSITION:  PACU - hemodynamically stable.   Delay start of Pharmacological VTE agent (>24hrs) due to surgical blood loss or risk of bleeding: not applicable

## 2023-06-25 ENCOUNTER — Encounter (HOSPITAL_COMMUNITY): Payer: Self-pay | Admitting: Urology

## 2023-06-25 NOTE — Anesthesia Postprocedure Evaluation (Signed)
Anesthesia Post Note  Patient: Randy Cross  Procedure(s) Performed: CYSTOSCOPY WITH BILATERAL RETROGRADE PYELOGRAM, URETEROSCOPY , BASKETRY OF STONE AND STENT PLACEMENT (Ureter) TRANSURETHRAL RESECTION OF BLADDER TUMOR (TURBT) (Bladder)     Patient location during evaluation: PACU Anesthesia Type: General Level of consciousness: awake and alert Pain management: pain level controlled Vital Signs Assessment: post-procedure vital signs reviewed and stable Respiratory status: spontaneous breathing, nonlabored ventilation, respiratory function stable and patient connected to nasal cannula oxygen Cardiovascular status: blood pressure returned to baseline and stable Postop Assessment: no apparent nausea or vomiting Anesthetic complications: no   No notable events documented.  Last Vitals:  Vitals:   06/24/23 1900 06/24/23 1915  BP: (!) 138/98 (!) 138/98  Pulse: 73 84  Resp: 13 16  Temp:  36.8 C  SpO2: 96% 99%    Last Pain:  Vitals:   06/24/23 1915  TempSrc:   PainSc: 5                  Mariann Barter

## 2023-06-26 ENCOUNTER — Inpatient Hospital Stay (HOSPITAL_COMMUNITY)
Admission: EM | Admit: 2023-06-26 | Discharge: 2023-06-29 | DRG: 920 | Disposition: A | Discharge status: Home / Self Care | Payer: Self-pay | Attending: Urology | Admitting: Urology

## 2023-06-26 ENCOUNTER — Emergency Department (HOSPITAL_COMMUNITY): Payer: Self-pay

## 2023-06-26 ENCOUNTER — Encounter (HOSPITAL_COMMUNITY): Payer: Self-pay

## 2023-06-26 ENCOUNTER — Other Ambulatory Visit: Payer: Self-pay

## 2023-06-26 DIAGNOSIS — R319 Hematuria, unspecified: Secondary | ICD-10-CM | POA: Diagnosis present

## 2023-06-26 DIAGNOSIS — N132 Hydronephrosis with renal and ureteral calculous obstruction: Secondary | ICD-10-CM | POA: Diagnosis present

## 2023-06-26 DIAGNOSIS — Z960 Presence of urogenital implants: Secondary | ICD-10-CM | POA: Diagnosis present

## 2023-06-26 DIAGNOSIS — N99820 Postprocedural hemorrhage and hematoma of a genitourinary system organ or structure following a genitourinary system procedure: Principal | ICD-10-CM | POA: Diagnosis present

## 2023-06-26 DIAGNOSIS — F17210 Nicotine dependence, cigarettes, uncomplicated: Secondary | ICD-10-CM | POA: Diagnosis present

## 2023-06-26 DIAGNOSIS — C689 Malignant neoplasm of urinary organ, unspecified: Secondary | ICD-10-CM | POA: Diagnosis present

## 2023-06-26 DIAGNOSIS — R310 Gross hematuria: Secondary | ICD-10-CM | POA: Diagnosis present

## 2023-06-26 DIAGNOSIS — Y838 Other surgical procedures as the cause of abnormal reaction of the patient, or of later complication, without mention of misadventure at the time of the procedure: Secondary | ICD-10-CM | POA: Diagnosis present

## 2023-06-26 LAB — COMPREHENSIVE METABOLIC PANEL
ALT: 172 U/L — ABNORMAL HIGH (ref 0–44)
AST: 78 U/L — ABNORMAL HIGH (ref 15–41)
Albumin: 4 g/dL (ref 3.5–5.0)
Alkaline Phosphatase: 57 U/L (ref 38–126)
Anion gap: 9 (ref 5–15)
BUN: 21 mg/dL — ABNORMAL HIGH (ref 6–20)
CO2: 28 mmol/L (ref 22–32)
Calcium: 9.4 mg/dL (ref 8.9–10.3)
Chloride: 101 mmol/L (ref 98–111)
Creatinine, Ser: 0.96 mg/dL (ref 0.61–1.24)
GFR, Estimated: >60 mL/min (ref >60)
Glucose, Bld: 98 mg/dL (ref 70–99)
Potassium: 3.9 mmol/L (ref 3.5–5.1)
Sodium: 138 mmol/L (ref 135–145)
Total Bilirubin: 0.4 mg/dL (ref 0.3–1.2)
Total Protein: 7.1 g/dL (ref 6.5–8.1)

## 2023-06-26 LAB — URINALYSIS, ROUTINE W REFLEX MICROSCOPIC

## 2023-06-26 LAB — CBC WITH DIFFERENTIAL/PLATELET
Abs Immature Granulocytes: 0.02 10*3/uL (ref 0.00–0.07)
Basophils Absolute: 0 10*3/uL (ref 0.0–0.1)
Basophils Relative: 1 %
Eosinophils Absolute: 0.1 10*3/uL (ref 0.0–0.5)
Eosinophils Relative: 1 %
HCT: 43.8 % (ref 39.0–52.0)
Hemoglobin: 14.9 g/dL (ref 13.0–17.0)
Immature Granulocytes: 0 %
Lymphocytes Relative: 33 %
Lymphs Abs: 2.1 10*3/uL (ref 0.7–4.0)
MCH: 31.9 pg (ref 26.0–34.0)
MCHC: 34 g/dL (ref 30.0–36.0)
MCV: 93.8 fL (ref 80.0–100.0)
Monocytes Absolute: 0.8 10*3/uL (ref 0.1–1.0)
Monocytes Relative: 12 %
Neutro Abs: 3.3 10*3/uL (ref 1.7–7.7)
Neutrophils Relative %: 53 %
Platelets: 301 10*3/uL (ref 150–400)
RBC: 4.67 MIL/uL (ref 4.22–5.81)
RDW: 12.6 % (ref 11.5–15.5)
WBC: 6.3 10*3/uL (ref 4.0–10.5)
nRBC: 0 % (ref 0.0–0.2)

## 2023-06-26 LAB — URINALYSIS, MICROSCOPIC (REFLEX)
Bacteria, UA: NONE SEEN
RBC / HPF: >50 RBC/hpf (ref 0–5)

## 2023-06-26 LAB — TROPONIN I (HIGH SENSITIVITY)
Troponin I (High Sensitivity): 4 ng/L (ref <18)
Troponin I (High Sensitivity): 5 ng/L (ref <18)

## 2023-06-26 MED ORDER — SODIUM CHLORIDE 0.9 % IV BOLUS
1000.0000 mL | Freq: Once | INTRAVENOUS | Status: AC
  Administered 2023-06-26: 1000 mL via INTRAVENOUS

## 2023-06-26 MED ORDER — HYDROMORPHONE HCL 1 MG/ML IJ SOLN
1.0000 mg | Freq: Once | INTRAMUSCULAR | Status: AC
  Administered 2023-06-26: 1 mg via INTRAVENOUS
  Filled 2023-06-26: qty 1

## 2023-06-26 MED ORDER — MORPHINE SULFATE (PF) 4 MG/ML IV SOLN
4.0000 mg | Freq: Once | INTRAVENOUS | Status: AC
  Administered 2023-06-26: 4 mg via INTRAVENOUS
  Filled 2023-06-26: qty 1

## 2023-06-26 MED ORDER — OXYCODONE-ACETAMINOPHEN 5-325 MG PO TABS
1.0000 | ORAL_TABLET | Freq: Four times a day (QID) | ORAL | Status: DC | PRN
  Administered 2023-06-27 – 2023-06-28 (×7): 2 via ORAL
  Filled 2023-06-26 (×7): qty 2

## 2023-06-26 MED ORDER — ONDANSETRON HCL 4 MG/2ML IJ SOLN: 4.0000 mg | INTRAMUSCULAR | Status: DC | PRN

## 2023-06-26 MED ORDER — DIPHENHYDRAMINE HCL 12.5 MG/5ML PO ELIX
12.5000 mg | ORAL_SOLUTION | Freq: Four times a day (QID) | ORAL | Status: DC | PRN
  Administered 2023-06-27 (×2): 12.5 mg via ORAL
  Filled 2023-06-26 (×3): qty 5

## 2023-06-26 MED ORDER — HYDROMORPHONE HCL 1 MG/ML IJ SOLN
0.5000 mg | INTRAMUSCULAR | Status: DC | PRN
  Administered 2023-06-26 – 2023-06-28 (×5): 1 mg via INTRAVENOUS
  Filled 2023-06-26 (×5): qty 1

## 2023-06-26 MED ORDER — DIPHENHYDRAMINE HCL 50 MG/ML IJ SOLN: 12.5000 mg | Freq: Four times a day (QID) | INTRAMUSCULAR | Status: DC | PRN

## 2023-06-26 MED ORDER — IOHEXOL 350 MG/ML SOLN
75.0000 mL | Freq: Once | INTRAVENOUS | Status: AC | PRN
  Administered 2023-06-26: 75 mL via INTRAVENOUS

## 2023-06-26 MED ORDER — ONDANSETRON HCL 4 MG/2ML IJ SOLN
4.0000 mg | Freq: Once | INTRAMUSCULAR | Status: AC
  Administered 2023-06-26: 4 mg via INTRAVENOUS
  Filled 2023-06-26: qty 2

## 2023-06-26 MED ORDER — LIDOCAINE HCL URETHRAL/MUCOSAL 2 % EX GEL
1.0000 | Freq: Once | CUTANEOUS | Status: DC
  Filled 2023-06-26: qty 11

## 2023-06-26 MED ORDER — SODIUM CHLORIDE 0.9 % IV SOLN: INTRAVENOUS | Status: DC

## 2023-06-26 NOTE — ED Triage Notes (Signed)
Patient has cystoscopy 2 days ago. Patient reports hematuria and dizziness x 1 day. Patient attempted to LWBS, but when he got to the parking lot he's started to have severe right flank pain and passed a large clot out of his penis.

## 2023-06-26 NOTE — H&P (Signed)
Urology Consult   Physician requesting consult: Dr. Silverio Lay  Reason for consult: Hematuria  History of Present Illness: Randy Cross is a 32 y.o. who is s/p a large TURBT and right ureteral stone removal and right ureteral stent placement by Dr. Berneice Heinrich 2 days ago.  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  He continued to have hematuria which worsened today and resulted in difficulty voiding.  He has had ongoing right-sided flank pain consistent with his right ureteral stent.  He presented to the emergency department and a CT scan of the abdomen and pelvis was performed revealing a large amount of clot within the bladder.  Since his CT scan, he has now voided twice and voided a large amount of clot with subsequent relief of his suprapubic pressure.  He continues to have right-sided flank pain related to his indwelling right ureteral stent.   History reviewed. No pertinent past medical history.  Past Surgical History:  Procedure Laterality Date   ABDOMINAL SURGERY     CYSTOSCOPY WITH RETROGRADE PYELOGRAM, URETEROSCOPY AND STENT PLACEMENT N/A 06/24/2023   Procedure: CYSTOSCOPY WITH BILATERAL RETROGRADE PYELOGRAM, URETEROSCOPY , BASKETRY OF STONE AND STENT PLACEMENT;  Surgeon: Loletta Parish., MD;  Location: [facility];  Service: Urology;  Laterality: N/A;   TRANSURETHRAL RESECTION OF BLADDER TUMOR N/A 06/24/2023   Procedure: TRANSURETHRAL RESECTION OF BLADDER TUMOR (TURBT);  Surgeon: Loletta Parish., MD;  Location: [facility];  Service: Urology;  Laterality: N/A;    Current Hospital Medications:  Home Meds:  No current facility-administered medications on file prior to encounter.   Current Outpatient Medications on File Prior to Encounter  Medication Sig Dispense Refill   cephALEXin (KEFLEX) 500 MG capsule Take 1 capsule (500 mg total) by mouth 2 (two) times daily for 3 days. Begin day before next Urology appointment. 6 capsule 0   ketorolac (TORADOL) 10 MG tablet  Take 1 tablet (10 mg total) by mouth every 6 (six) hours as needed for moderate pain (or stent discomfort post-operatively). 20 tablet 0   oxyCODONE-acetaminophen (PERCOCET) 5-325 MG tablet Take 1-2 tablets by mouth every 6 (six) hours as needed for severe pain (post-operatively). 15 tablet 0   senna-docusate (SENOKOT-S) 8.6-50 MG tablet Take 1 tablet by mouth 2 (two) times daily. While taking strongest pain meds to prevent constipation 10 tablet 0     Scheduled Meds:  lidocaine  1 Application Topical Once   Continuous Infusions: PRN Meds:.  Allergies: No Known Allergies  History reviewed. No pertinent family history.  Social History:  reports that he has been smoking cigarettes. He has never used smokeless tobacco. He reports that he does not currently use alcohol. He reports that he does not currently use drugs after having used the following drugs: Marijuana and Cocaine.  ROS: A complete review of systems was performed.  All systems are negative except for pertinent findings as noted.  Physical Exam:  Vital signs in last 24 hours: Temp:  [97.6 F (36.4 C)-98.5 F (36.9 C)] 98.5 F (36.9 C) (08/29 2110) Pulse Rate:  [70-119] 82 (08/29 2015) Resp:  [8-21] 17 (08/29 2100) BP: (121-159)/(75-105) 124/83 (08/29 2100) SpO2:  [95 %-100 %] 95 % (08/29 2015) Weight:  [70.3 kg] 70.3 kg (08/29 1632) Constitutional:  Alert and oriented, No acute distress Cardiovascular: Regular rate and rhythm, No JVD Respiratory: Normal respiratory effort GI: Mild suprapubic tenderness with minimal mild distention now. GU: Moderate right CVA tenderness Lymphatic: No lymphadenopathy Neurologic: Grossly intact, no focal deficits  Psychiatric: Normal mood and affect  Laboratory Data:  Recent Labs    06/26/23 1658  WBC 6.3  HGB 14.9  HCT 43.8  PLT 301    Recent Labs    06/26/23 1658  NA 138  K 3.9  CL 101  GLUCOSE 98  BUN 21*  CALCIUM 9.4  CREATININE 0.96     Results for orders  placed or performed during the hospital encounter of 06/26/23 (from the past 24 hour(s))  CBC with Differential     Status: None   Collection Time: 06/26/23  4:58 PM  Result Value Ref Range   WBC 6.3 4.0 - 10.5 K/uL   RBC 4.67 4.22 - 5.81 MIL/uL   Hemoglobin 14.9 13.0 - 17.0 g/dL   HCT 16.1 09.6 - 04.5 %   MCV 93.8 80.0 - 100.0 fL   MCH 31.9 26.0 - 34.0 pg   MCHC 34.0 30.0 - 36.0 g/dL   RDW 40.9 81.1 - 91.4 %   Platelets 301 150 - 400 K/uL   nRBC 0.0 0.0 - 0.2 %   Neutrophils Relative % 53 %   Neutro Abs 3.3 1.7 - 7.7 K/uL   Lymphocytes Relative 33 %   Lymphs Abs 2.1 0.7 - 4.0 K/uL   Monocytes Relative 12 %   Monocytes Absolute 0.8 0.1 - 1.0 K/uL   Eosinophils Relative 1 %   Eosinophils Absolute 0.1 0.0 - 0.5 K/uL   Basophils Relative 1 %   Basophils Absolute 0.0 0.0 - 0.1 K/uL   Immature Granulocytes 0 %   Abs Immature Granulocytes 0.02 0.00 - 0.07 K/uL  Comprehensive metabolic panel     Status: Abnormal   Collection Time: 06/26/23  4:58 PM  Result Value Ref Range   Sodium 138 135 - 145 mmol/L   Potassium 3.9 3.5 - 5.1 mmol/L   Chloride 101 98 - 111 mmol/L   CO2 28 22 - 32 mmol/L   Glucose, Bld 98 70 - 99 mg/dL   BUN 21 (H) 6 - 20 mg/dL   Creatinine, Ser 7.82 0.61 - 1.24 mg/dL   Calcium 9.4 8.9 - 95.6 mg/dL   Total Protein 7.1 6.5 - 8.1 g/dL   Albumin 4.0 3.5 - 5.0 g/dL   AST 78 (H) 15 - 41 U/L   ALT 172 (H) 0 - 44 U/L   Alkaline Phosphatase 57 38 - 126 U/L   Total Bilirubin 0.4 0.3 - 1.2 mg/dL   GFR, Estimated >21 >30 mL/min   Anion gap 9 5 - 15  Troponin I (High Sensitivity)     Status: None   Collection Time: 06/26/23  4:58 PM  Result Value Ref Range   Troponin I (High Sensitivity) 4 <18 ng/L  Urinalysis, Routine w reflex microscopic -Urine, Catheterized     Status: Abnormal   Collection Time: 06/26/23  6:44 PM  Result Value Ref Range   Color, Urine RED (A) YELLOW   APPearance TURBID (A) CLEAR   Specific Gravity, Urine  1.005 - 1.030    TEST NOT REPORTED DUE  TO COLOR INTERFERENCE OF URINE PIGMENT   pH  5.0 - 8.0    TEST NOT REPORTED DUE TO COLOR INTERFERENCE OF URINE PIGMENT   Glucose, UA (A) NEGATIVE mg/dL    TEST NOT REPORTED DUE TO COLOR INTERFERENCE OF URINE PIGMENT   Hgb urine dipstick (A) NEGATIVE    TEST NOT REPORTED DUE TO COLOR INTERFERENCE OF URINE PIGMENT   Bilirubin Urine (A) NEGATIVE    TEST  NOT REPORTED DUE TO COLOR INTERFERENCE OF URINE PIGMENT   Ketones, ur (A) NEGATIVE mg/dL    TEST NOT REPORTED DUE TO COLOR INTERFERENCE OF URINE PIGMENT   Protein, ur (A) NEGATIVE mg/dL    TEST NOT REPORTED DUE TO COLOR INTERFERENCE OF URINE PIGMENT   Nitrite (A) NEGATIVE    TEST NOT REPORTED DUE TO COLOR INTERFERENCE OF URINE PIGMENT   Leukocytes,Ua (A) NEGATIVE    TEST NOT REPORTED DUE TO COLOR INTERFERENCE OF URINE PIGMENT  Urinalysis, Microscopic (reflex)     Status: None   Collection Time: 06/26/23  6:44 PM  Result Value Ref Range   RBC / HPF >50 0 - 5 RBC/hpf   WBC, UA 0-5 0 - 5 WBC/hpf   Bacteria, UA NONE SEEN NONE SEEN   Squamous Epithelial / HPF 0-5 0 - 5 /HPF  Troponin I (High Sensitivity)     Status: None   Collection Time: 06/26/23  8:19 PM  Result Value Ref Range   Troponin I (High Sensitivity) 5 <18 ng/L   No results found for this or any previous visit (from the past 240 hour(s)).  Renal Function: Recent Labs    06/19/23 2334 06/20/23 1944 06/26/23 1658  CREATININE 1.35* 1.00 0.96   Estimated Creatinine Clearance: 109.8 mL/min (by C-G formula based on SCr of 0.96 mg/dL).  Radiologic Imaging: CT Angio Chest PE W and/or Wo Contrast  Result Date: 06/26/2023 CLINICAL DATA:  Dizziness. High probability for pulmonary embolus. History of right ureteral stone bladder neoplasm. Status post cystoscopy and TURBT 2 days ago. EXAM: CT ANGIOGRAPHY CHEST WITH CONTRAST TECHNIQUE: Multidetector CT imaging of the chest was performed using the standard protocol during bolus administration of intravenous contrast. Multiplanar CT  image reconstructions and MIPs were obtained to evaluate the vascular anatomy. RADIATION DOSE REDUCTION: This exam was performed according to the departmental dose-optimization program which includes automated exposure control, adjustment of the mA and/or kV according to patient size and/or use of iterative reconstruction technique. CONTRAST:  75mL OMNIPAQUE IOHEXOL 350 MG/ML SOLN COMPARISON:  No comparison studies available. FINDINGS: Cardiovascular: The heart size is normal. No substantial pericardial effusion. There is no filling defect within the opacified pulmonary arteries to suggest the presence of an acute pulmonary embolus. Mediastinum/Nodes: 13 mm short axis subcarinal lymph node seen on 79/5. No hilar lymphadenopathy. The esophagus has normal imaging features. There is no axillary lymphadenopathy. Lungs/Pleura: No suspicious pulmonary nodule or mass. No focal airspace consolidation. No pleural effusion. Dependent atelectasis noted in the lower lungs bilaterally. Upper Abdomen: Unremarkable Musculoskeletal: No worrisome lytic or sclerotic osseous abnormality. Review of the MIP images confirms the above findings. IMPRESSION: 1. No CT evidence for acute pulmonary embolus. 2. 13 mm short axis subcarinal lymph node. This is nonspecific and may be reactive. Follow-up CT chest in 3 months recommended to ensure stability. Electronically Signed   By: Kennith Center M.D.   On: 06/26/2023 19:50   CT Renal Stone Study  Result Date: 06/26/2023 CLINICAL DATA:  Cystoscopy 2 days ago, hematuria and dizziness for 1 day, right flank pain EXAM: CT ABDOMEN AND PELVIS WITHOUT CONTRAST TECHNIQUE: Multidetector CT imaging of the abdomen and pelvis was performed following the standard protocol without IV contrast. RADIATION DOSE REDUCTION: This exam was performed according to the departmental dose-optimization program which includes automated exposure control, adjustment of the mA and/or kV according to patient size and/or  use of iterative reconstruction technique. COMPARISON:  06/20/2023 FINDINGS: Lower chest: No acute pleural or parenchymal lung disease.  Hepatobiliary: Unremarkable unenhanced appearance of the liver and gallbladder. Pancreas: Unremarkable unenhanced appearance. Spleen: Unremarkable unenhanced appearance. Adrenals/Urinary Tract: Bladder is moderately distended, with a 6.5 by 8.4 x 7.9 cm hyperdense region within the bladder lumen consistent with blood clot after recent urologic procedure. The mass previously seen along the posterior aspect of the bladder wall is not visualized, likely resected in the interim. There is a right ureteral stent, extending from the right renal pelvis to the bladder lumen. The distal margin of the stent is coiled within the blood clot within the bladder lumen. Mild right-sided hydronephrosis has developed despite indwelling stent placement, may reflect stent malfunction due to the large bladder clot described above. Please note the calcifications seen within the right lower pelvis on prior study is separate from the right ureter, and is consistent with a phlebolith. No urinary tract calculi within either kidney. The adrenals are unremarkable. Stomach/Bowel: No bowel obstruction or ileus. Normal appendix right lower quadrant. Large amount of retained stool throughout the colon consistent with constipation. No bowel wall thickening or inflammatory change. Vascular/Lymphatic: No significant vascular findings on this unenhanced exam. No pathologic adenopathy. Reproductive: Prostate is unremarkable. Other: There is trace pelvic free fluid. No free intraperitoneal gas. No abdominal wall hernia. Musculoskeletal: No acute or destructive bony abnormalities. Reconstructed images demonstrate no additional findings. IMPRESSION: 1. Large hyperdense region within the bladder lumen, measuring up to 8.4 cm, consistent with blood clot after recent urologic procedure. 2. Interval placement of a right  ureteral stent, extending from the right renal pelvis to the bladder lumen. The distal margin of the stent is within the central aspect of the bladder could clot described above, with resulting mild right hydronephrosis. 3. Large amount of retained stool throughout the colon consistent with constipation. No bowel obstruction or ileus. 4. Trace pelvic free fluid. Electronically Signed   By: Sharlet Salina M.D.   On: 06/26/2023 19:49    I independently reviewed the above imaging studies.  Impression/Recommendation: 1.  Hematuria/clot retention: He has now voided a large amount of clot twice since his CT scan.  I did offer him placement of a hematuria catheter this evening to irrigate additional clot.  At this time, with relief after voiding, he refuses a catheter and wishes to avoid this if possible.  I have recommended that he be admitted to both monitor his hemoglobin level and for IV fluid hydration.  If he develops recurrent clot retention tonight, he understands that he will need a hematuria catheter placed at that time.  He will also be made n.p.o. after midnight in case catheter management is unsuccessful and he requires a clot evacuation tomorrow.  If he continues to void well and his urine clears overnight, he may be able to be discharged with conservative management.  Crecencio Mc 06/26/2023, 9:31 PM    Moody Bruins MD   CC: Dr. Silverio Lay

## 2023-06-26 NOTE — ED Notes (Signed)
.ED TO INPATIENT HANDOFF REPORT  Name/Age/Gender Randy Cross 32 y.o. male  Code Status    Code Status Orders  (From admission, onward)           Start     Ordered   06/26/23 2137  Full code  Continuous       Question:  By:  Answer:  Consent: discussion documented in EHR   06/26/23 2139           Code Status History     This patient has a current code status but no historical code status.       Home/SNF/Other Home  Chief Complaint Hematuria [R31.9]  Level of Care/Admitting Diagnosis ED Disposition     ED Disposition  Admit   Condition  --   Comment  Hospital Area: [facility] [[id number]]  Level of Care: Med-Surg [16]  May place patient in observation at [facility] or [facility] if equivalent level of care is available:: Yes  Covid Evaluation: Asymptomatic - no recent exposure (last 10 days) testing not required  Diagnosis: Hematuria [784696]  Admitting Physician: Heloise Purpura [[id number]]  Attending Physician: Loletta Parish [phone]          Medical History History reviewed. No pertinent past medical history.  Allergies No Known Allergies  IV Location/Drains/Wounds Patient Lines/Drains/Airways Status     Active Line/Drains/Airways     Name Placement date Placement time Site Days   Peripheral IV 06/26/23 20 G Right Antecubital 06/26/23  1657  Antecubital  less than 1   Ureteral Drain/Stent Right ureter 5 Fr. 06/24/23  1730  Right ureter  2            Labs/Imaging Results for orders placed or performed during the hospital encounter of 06/26/23 (from the past 48 hour(s))  CBC with Differential     Status: None   Collection Time: 06/26/23  4:58 PM  Result Value Ref Range   WBC 6.3 4.0 - 10.5 K/uL   RBC 4.67 4.22 - 5.81 MIL/uL   Hemoglobin 14.9 13.0 - 17.0 g/dL   HCT 84.1 32.4 - 40.1 %   MCV 93.8 80.0 - 100.0 fL   MCH 31.9 26.0 - 34.0 pg   MCHC 34.0 30.0 - 36.0 g/dL   RDW 02.7 25.3 - 66.4 %   Platelets 301  150 - 400 K/uL   nRBC 0.0 0.0 - 0.2 %   Neutrophils Relative % 53 %   Neutro Abs 3.3 1.7 - 7.7 K/uL   Lymphocytes Relative 33 %   Lymphs Abs 2.1 0.7 - 4.0 K/uL   Monocytes Relative 12 %   Monocytes Absolute 0.8 0.1 - 1.0 K/uL   Eosinophils Relative 1 %   Eosinophils Absolute 0.1 0.0 - 0.5 K/uL   Basophils Relative 1 %   Basophils Absolute 0.0 0.0 - 0.1 K/uL   Immature Granulocytes 0 %   Abs Immature Granulocytes 0.02 0.00 - 0.07 K/uL    Comment: Performed at [facility], [address], [place], [place] [address]  Comprehensive metabolic panel     Status: Abnormal   Collection Time: 06/26/23  4:58 PM  Result Value Ref Range   Sodium 138 135 - 145 mmol/L   Potassium 3.9 3.5 - 5.1 mmol/L   Chloride 101 98 - 111 mmol/L   CO2 28 22 - 32 mmol/L   Glucose, Bld 98 70 - 99 mg/dL    Comment: Glucose reference range applies only to samples  taken after fasting for at least 8 hours.   BUN 21 (H) 6 - 20 mg/dL   Creatinine, Ser 2.13 0.61 - 1.24 mg/dL   Calcium 9.4 8.9 - 08.6 mg/dL   Total Protein 7.1 6.5 - 8.1 g/dL   Albumin 4.0 3.5 - 5.0 g/dL   AST 78 (H) 15 - 41 U/L   ALT 172 (H) 0 - 44 U/L   Alkaline Phosphatase 57 38 - 126 U/L   Total Bilirubin 0.4 0.3 - 1.2 mg/dL   GFR, Estimated >57 >84 mL/min    Comment: (NOTE) Calculated using the CKD-EPI Creatinine Equation (2021)    Anion gap 9 5 - 15    Comment: Performed at [facility], [address], [place], [place] [address]  Troponin I (High Sensitivity)     Status: None   Collection Time: 06/26/23  4:58 PM  Result Value Ref Range   Troponin I (High Sensitivity) 4 <18 ng/L    Comment: (NOTE) Elevated high sensitivity troponin I (hsTnI) values and significant  changes across serial measurements may suggest ACS but many other  chronic and acute conditions are known to elevate hsTnI results.  Refer to the "Links" section for chest pain algorithms and additional  guidance. Performed at [facility], [address] [place], [place] [address]   Urinalysis, Routine w reflex microscopic -Urine, Catheterized     Status: Abnormal   Collection Time: 06/26/23  6:44 PM  Result Value Ref Range   Color, Urine RED (A) YELLOW   APPearance TURBID (A) CLEAR   Specific Gravity, Urine  1.005 - 1.030    TEST NOT REPORTED DUE TO COLOR INTERFERENCE OF URINE PIGMENT   pH  5.0 - 8.0    TEST NOT REPORTED DUE TO COLOR INTERFERENCE OF URINE PIGMENT   Glucose, UA (A) NEGATIVE mg/dL    TEST NOT REPORTED DUE TO COLOR INTERFERENCE OF URINE PIGMENT   Hgb urine dipstick (A) NEGATIVE    TEST NOT REPORTED DUE TO COLOR INTERFERENCE OF URINE PIGMENT   Bilirubin Urine (A) NEGATIVE    TEST NOT REPORTED DUE TO COLOR INTERFERENCE OF URINE PIGMENT   Ketones, ur (A) NEGATIVE mg/dL    TEST NOT REPORTED DUE TO COLOR INTERFERENCE OF URINE PIGMENT   Protein, ur (A) NEGATIVE mg/dL    TEST NOT REPORTED DUE TO COLOR INTERFERENCE OF URINE PIGMENT   Nitrite (A) NEGATIVE    TEST NOT REPORTED DUE TO COLOR INTERFERENCE OF URINE PIGMENT   Leukocytes,Ua (A) NEGATIVE    TEST NOT REPORTED DUE TO COLOR INTERFERENCE OF URINE PIGMENT    Comment: Performed at [facility], [address] [place], [place] [address]  Urinalysis, Microscopic (reflex)     Status: None   Collection Time: 06/26/23  6:44 PM  Result Value Ref Range   RBC / HPF >50 0 - 5 RBC/hpf   WBC, UA 0-5 0 - 5 WBC/hpf   Bacteria, UA NONE SEEN NONE SEEN   Squamous Epithelial / HPF 0-5 0 - 5 /HPF    Comment: Performed at [facility], [address] [place], [place] [address]  Troponin I (High Sensitivity)     Status: None   Collection Time: 06/26/23  8:19 PM  Result Value Ref Range   Troponin I (High Sensitivity) 5 <18 ng/L    Comment: (NOTE) Elevated high sensitivity troponin I (hsTnI) values and significant  changes across serial measurements may suggest ACS but many other  chronic and acute  conditions are known to elevate hsTnI results.  Refer to the "Links" section for chest pain algorithms and additional  guidance. Performed at [facility], [address], [place], [place] [address]    CT Angio Chest PE W and/or Wo Contrast  Result Date: 06/26/2023 CLINICAL DATA:  Dizziness. High probability for pulmonary embolus. History of right ureteral stone bladder neoplasm. Status post cystoscopy and TURBT 2 days ago. EXAM: CT ANGIOGRAPHY CHEST WITH CONTRAST TECHNIQUE: Multidetector CT imaging of the chest was performed using the standard protocol during bolus administration of intravenous contrast. Multiplanar CT image reconstructions and MIPs were obtained to evaluate the vascular anatomy. RADIATION DOSE REDUCTION: This exam was performed according to the departmental dose-optimization program which includes automated exposure control, adjustment of the mA and/or kV according to patient size and/or use of iterative reconstruction technique. CONTRAST:  75mL OMNIPAQUE IOHEXOL 350 MG/ML SOLN COMPARISON:  No comparison studies available. FINDINGS: Cardiovascular: The heart size is normal. No substantial pericardial effusion. There is no filling defect within the opacified pulmonary arteries to suggest the presence of an acute pulmonary embolus. Mediastinum/Nodes: 13 mm short axis subcarinal lymph node seen on 79/5. No hilar lymphadenopathy. The esophagus has normal imaging features. There is no axillary lymphadenopathy. Lungs/Pleura: No suspicious pulmonary nodule or mass. No focal airspace consolidation. No pleural effusion. Dependent atelectasis noted in the lower lungs bilaterally. Upper Abdomen: Unremarkable Musculoskeletal: No worrisome lytic or sclerotic osseous abnormality. Review of the MIP images confirms the above findings. IMPRESSION: 1. No CT evidence for acute pulmonary embolus. 2. 13 mm short axis subcarinal lymph node. This is nonspecific and may be reactive. Follow-up  CT chest in 3 months recommended to ensure stability. Electronically Signed   By: Kennith Center M.D.   On: 06/26/2023 19:50   CT Renal Stone Study  Result Date: 06/26/2023 CLINICAL DATA:  Cystoscopy 2 days ago, hematuria and dizziness for 1 day, right flank pain EXAM: CT ABDOMEN AND PELVIS WITHOUT CONTRAST TECHNIQUE: Multidetector CT imaging of the abdomen and pelvis was performed following the standard protocol without IV contrast. RADIATION DOSE REDUCTION: This exam was performed according to the departmental dose-optimization program which includes automated exposure control, adjustment of the mA and/or kV according to patient size and/or use of iterative reconstruction technique. COMPARISON:  06/20/2023 FINDINGS: Lower chest: No acute pleural or parenchymal lung disease. Hepatobiliary: Unremarkable unenhanced appearance of the liver and gallbladder. Pancreas: Unremarkable unenhanced appearance. Spleen: Unremarkable unenhanced appearance. Adrenals/Urinary Tract: Bladder is moderately distended, with a 6.5 by 8.4 x 7.9 cm hyperdense region within the bladder lumen consistent with blood clot after recent urologic procedure. The mass previously seen along the posterior aspect of the bladder wall is not visualized, likely resected in the interim. There is a right ureteral stent, extending from the right renal pelvis to the bladder lumen. The distal margin of the stent is coiled within the blood clot within the bladder lumen. Mild right-sided hydronephrosis has developed despite indwelling stent placement, may reflect stent malfunction due to the large bladder clot described above. Please note the calcifications seen within the right lower pelvis on prior study is separate from the right ureter, and is consistent with a phlebolith. No urinary tract calculi within either kidney. The adrenals are unremarkable. Stomach/Bowel: No bowel obstruction or ileus. Normal appendix right lower quadrant. Large amount of  retained stool throughout the colon consistent with constipation. No bowel wall thickening or inflammatory change. Vascular/Lymphatic: No significant vascular findings on this unenhanced exam.  No pathologic adenopathy. Reproductive: Prostate is unremarkable. Other: There is trace pelvic free fluid. No free intraperitoneal gas. No abdominal wall hernia. Musculoskeletal: No acute or destructive bony abnormalities. Reconstructed images demonstrate no additional findings. IMPRESSION: 1. Large hyperdense region within the bladder lumen, measuring up to 8.4 cm, consistent with blood clot after recent urologic procedure. 2. Interval placement of a right ureteral stent, extending from the right renal pelvis to the bladder lumen. The distal margin of the stent is within the central aspect of the bladder could clot described above, with resulting mild right hydronephrosis. 3. Large amount of retained stool throughout the colon consistent with constipation. No bowel obstruction or ileus. 4. Trace pelvic free fluid. Electronically Signed   By: Sharlet Salina M.D.   On: 06/26/2023 19:49    Pending Labs [employer] (From admission, onward)     Start     Ordered   Signed and Held  HIV Antibody (routine testing w rflx)  (HIV Antibody (Routine testing w reflex) panel)  Once,   R        Signed and Held   Signed and Held  Hemoglobin and hematocrit, blood  Tomorrow morning,   R        Signed and Held            Vitals/Pain Today's Vitals   06/26/23 2100 06/26/23 2110 06/26/23 2110 06/26/23 2230  BP: 124/83     Pulse:      Resp: 17   20  Temp:   98.5 F (36.9 C)   TempSrc:   Oral   SpO2:      Weight:      Height:      PainSc:  5       Isolation Precautions No active isolations  Medications Medications  lidocaine (XYLOCAINE) 2 % jelly 1 Application (0 Applications Topical Hold 06/26/23 2128)  morphine (PF) 4 MG/ML injection 4 mg (4 mg Intravenous Given 06/26/23 1720)  sodium chloride 0.9 % bolus  1,000 mL (0 mLs Intravenous Stopped 06/26/23 2126)  ondansetron (ZOFRAN) injection 4 mg (4 mg Intravenous Given 06/26/23 1739)  HYDROmorphone (DILAUDID) injection 1 mg (1 mg Intravenous Given 06/26/23 1739)  iohexol (OMNIPAQUE) 350 MG/ML injection 75 mL (75 mLs Intravenous Contrast Given 06/26/23 1910)  HYDROmorphone (DILAUDID) injection 1 mg (1 mg Intravenous Given 06/26/23 1848)  sodium chloride 0.9 % bolus 1,000 mL (0 mLs Intravenous Stopped 06/26/23 2126)  HYDROmorphone (DILAUDID) injection 1 mg (1 mg Intravenous Given 06/26/23 2001)    Mobility walks

## 2023-06-26 NOTE — ED Provider Notes (Signed)
[place] EMERGENCY DEPARTMENT AT [facility] Provider Note   CSN: [id number] Arrival date & time: 06/26/23  1517     History  Chief Complaint  Patient presents with   Flank Pain    Randy Cross is a 32 y.o. male who presented with right flank pain and hematuria and dizziness.  Patient just had bladder mass removal as well as right ureteral stent placement done by Dr. Berneice Heinrich 2 days ago.  Patient states that he has persistent hematuria.  He states that he passed a large clot and had severe bladder pain.  Patient also states that he has persistent right flank pain.  He has been taking Percocet and is now out of Percocet.  He also feels lightheaded and dizzy and had some chest pain and shortness of breath as well.  The history is provided by the patient.       Home Medications Prior to Admission medications   Medication Sig Start Date End Date Taking? Authorizing Provider  cephALEXin (KEFLEX) 500 MG capsule Take 1 capsule (500 mg total) by mouth 2 (two) times daily for 3 days. Begin day before next Urology appointment. 06/24/23 06/27/23  Loletta Parish., MD  ketorolac (TORADOL) 10 MG tablet Take 1 tablet (10 mg total) by mouth every 6 (six) hours as needed for moderate pain (or stent discomfort post-operatively). 06/24/23   Loletta Parish., MD  oxyCODONE-acetaminophen (PERCOCET) 5-325 MG tablet Take 1-2 tablets by mouth every 6 (six) hours as needed for severe pain (post-operatively). 06/24/23   Loletta Parish., MD  senna-docusate (SENOKOT-S) 8.6-50 MG tablet Take 1 tablet by mouth 2 (two) times daily. While taking strongest pain meds to prevent constipation 06/24/23   Berneice Heinrich Delbert Phenix., MD      Allergies    Patient has no known allergies.    Review of Systems   Review of Systems  Genitourinary:  Positive for flank pain.  All other systems reviewed and are negative.   Physical Exam Updated Vital Signs BP (!) 148/100 (BP Location: Left Arm)   Pulse  (!) 119   Temp 98.3 F (36.8 C) (Oral)   Resp (!) 21   Ht 5\' 11"  (1.803 m)   Wt 70.3 kg   SpO2 100%   BMI 21.62 kg/m  Physical Exam Vitals and nursing note reviewed.  Constitutional:      Comments: Uncomfortable  HENT:     Head: Normocephalic.     Nose: Nose normal.     Mouth/Throat:     Mouth: Mucous membranes are dry.  Eyes:     Extraocular Movements: Extraocular movements intact.     Pupils: Pupils are equal, round, and reactive to light.  Cardiovascular:     Rate and Rhythm: Regular rhythm. Tachycardia present.     Pulses: Normal pulses.     Heart sounds: Normal heart sounds.  Pulmonary:     Effort: Pulmonary effort is normal.     Breath sounds: Normal breath sounds.  Abdominal:     Comments: + Right CVA tenderness.  Musculoskeletal:        General: Normal range of motion.     Cervical back: Normal range of motion and neck supple.  Skin:    General: Skin is warm.     Capillary Refill: Capillary refill takes less than 2 seconds.  Neurological:     General: No focal deficit present.     Mental Status: He is alert and oriented to person,  place, and time.  Psychiatric:        Mood and Affect: Mood normal.        Behavior: Behavior normal.     ED Results / Procedures / Treatments   Labs (all labs ordered are listed, but only abnormal results are displayed) Labs Reviewed  COMPREHENSIVE METABOLIC PANEL - Abnormal; Notable for the following components:      Result Value   BUN 21 (*)    AST 78 (*)    ALT 172 (*)    All other components within normal limits  CBC WITH DIFFERENTIAL/PLATELET  URINALYSIS, ROUTINE W REFLEX MICROSCOPIC  TROPONIN I (HIGH SENSITIVITY)    EKG EKG Interpretation Date/Time:  Thursday June 26 2023 17:20:29 EDT Ventricular Rate:  96 PR Interval:  122 QRS Duration:  137 QT Interval:  345 QTC Calculation: 436 R Axis:   83  Text Interpretation: Sinus rhythm Right bundle branch block ST elevation, consider inferior injury unchanged  since previous Confirmed by Richardean Canal [phone]) on 06/26/2023 5:21:47 PM  Radiology No results found.  Procedures Procedures    Medications Ordered in ED Medications  sodium chloride 0.9 % bolus 1,000 mL (has no administration in time range)  morphine (PF) 4 MG/ML injection 4 mg (4 mg Intravenous Given 06/26/23 1720)  ondansetron (ZOFRAN) injection 4 mg (4 mg Intravenous Given 06/26/23 1739)  HYDROmorphone (DILAUDID) injection 1 mg (1 mg Intravenous Given 06/26/23 1739)    ED Course/ Medical Decision Making/ A&P                                 Medical Decision Making Randy Cross is a 32 y.o. male here presenting with hematuria after cystoscopy.  Concern for possible urinary retention versus expected hematuria after surgery versus complication from the ureteral stents.  Will get CT renal stone to confirm stent placement and also evaluate for clots in the bladder.  Patient's bladder scan showed 120 cc so he is not in retention.  Patient also has EKG changes with ST depressions in the lateral leads.  Will get troponin and CTA chest to rule out ACS versus PE.  8:31 PM I reviewed patient's lab labs and patient has normal hemoglobin.  Creatinine is normal.  However his urine showed greater than 50 RBCs with no obvious infection.  His CT showed a large clot in his bladder.  Patient also has right hydronephrosis.  I discussed with Dr. Laverle Patter from urology.  He states that he will come to bedside to evaluate patient and request urology cart.  9:29 PM Dr. Laverle Patter at bedside and evaluated patient.  He offered Foley placement versus observation and patient wants to hold off on Foley for now.  Urology to admit for persistent hematuria after procedure.  Problems Addressed: Gross hematuria: acute illness or injury  Amount and/or Complexity of Data Reviewed Labs: ordered. Decision-making details documented in ED Course. Radiology: ordered and independent interpretation performed. Decision-making  details documented in ED Course. ECG/medicine tests: ordered.  Risk Prescription drug management. Decision regarding hospitalization.    Final Clinical Impression(s) / ED Diagnoses Final diagnoses:  None    Rx / DC Orders ED Discharge Orders     None         Charlynne Pander, MD 06/26/23 2130

## 2023-06-27 LAB — HEMOGLOBIN AND HEMATOCRIT, BLOOD
HCT: 41.7 % (ref 39.0–52.0)
Hemoglobin: 13.7 g/dL (ref 13.0–17.0)

## 2023-06-27 LAB — HIV ANTIBODY (ROUTINE TESTING W REFLEX): HIV Screen 4th Generation wRfx: NONREACTIVE

## 2023-06-27 MED ORDER — ENSURE ENLIVE PO LIQD
237.0000 mL | Freq: Two times a day (BID) | ORAL | Status: DC
  Administered 2023-06-28 – 2023-06-29 (×3): 237 mL via ORAL

## 2023-06-27 MED ORDER — SENNOSIDES-DOCUSATE SODIUM 8.6-50 MG PO TABS
1.0000 | ORAL_TABLET | Freq: Two times a day (BID) | ORAL | Status: DC
  Administered 2023-06-27 – 2023-06-29 (×5): 1 via ORAL
  Filled 2023-06-27 (×5): qty 1

## 2023-06-27 NOTE — Progress Notes (Signed)
Subjective/Chief Complaint:  1 - Hematuria With Clots After Bladder Tumor REsection - s/p TURBT for TaG1 tumor and Rt ureteroscopy / non-tethered stent 06/24/23. Admit through ER 8/29 with large bladder clot and near retention, refused catheter. Hgb 13.7.   Today "Randy Cross" is improving. Passed some large clot material last night and voiding much easier now. Minimal pain.    Objective: Vital signs in last 24 hours: Temp:  [97.6 F (36.4 C)-98.5 F (36.9 C)] 97.8 F (36.6 C) (08/30 0542) Pulse Rate:  [57-119] 57 (08/30 0542) Resp:  [8-21] 16 (08/30 0542) BP: (113-159)/(75-105) 113/76 (08/30 0542) SpO2:  [95 %-100 %] 99 % (08/30 0542) Weight:  [66.7 kg-70.3 kg] 66.7 kg (08/30 0003) Last BM Date : 06/26/23  Intake/Output from previous day: 08/29 0701 - 08/30 0700 In: 558 [P.O.:30; I.V.:528] Out: 100 [Urine:100] Intake/Output this shift: Total I/O In: -  Out: 450 [Urine:450]  NAD, pleasant, at baseline, many tattoos Non-labored breathing on RA RRR SNTND, NO SP TTP No CVAT No c/c/e  Lab Results:  Recent Labs    06/26/23 1658 06/27/23 0333  WBC 6.3  --   HGB 14.9 13.7  HCT 43.8 41.7  PLT 301  --    BMET Recent Labs    06/26/23 1658  NA 138  K 3.9  CL 101  CO2 28  GLUCOSE 98  BUN 21*  CREATININE 0.96  CALCIUM 9.4   PT/INR No results for input(s): "LABPROT", "INR" in the last 72 hours. ABG No results for input(s): "PHART", "HCO3" in the last 72 hours.  Invalid input(s): "PCO2", "PO2"  Studies/Results: CT Angio Chest PE W and/or Wo Contrast  Result Date: 06/26/2023 CLINICAL DATA:  Dizziness. High probability for pulmonary embolus. History of right ureteral stone bladder neoplasm. Status post cystoscopy and TURBT 2 days ago. EXAM: CT ANGIOGRAPHY CHEST WITH CONTRAST TECHNIQUE: Multidetector CT imaging of the chest was performed using the standard protocol during bolus administration of intravenous contrast. Multiplanar CT image reconstructions and MIPs  were obtained to evaluate the vascular anatomy. RADIATION DOSE REDUCTION: This exam was performed according to the departmental dose-optimization program which includes automated exposure control, adjustment of the mA and/or kV according to patient size and/or use of iterative reconstruction technique. CONTRAST:  75mL OMNIPAQUE IOHEXOL 350 MG/ML SOLN COMPARISON:  No comparison studies available. FINDINGS: Cardiovascular: The heart size is normal. No substantial pericardial effusion. There is no filling defect within the opacified pulmonary arteries to suggest the presence of an acute pulmonary embolus. Mediastinum/Nodes: 13 mm short axis subcarinal lymph node seen on 79/5. No hilar lymphadenopathy. The esophagus has normal imaging features. There is no axillary lymphadenopathy. Lungs/Pleura: No suspicious pulmonary nodule or mass. No focal airspace consolidation. No pleural effusion. Dependent atelectasis noted in the lower lungs bilaterally. Upper Abdomen: Unremarkable Musculoskeletal: No worrisome lytic or sclerotic osseous abnormality. Review of the MIP images confirms the above findings. IMPRESSION: 1. No CT evidence for acute pulmonary embolus. 2. 13 mm short axis subcarinal lymph node. This is nonspecific and may be reactive. Follow-up CT chest in 3 months recommended to ensure stability. Electronically Signed   By: Kennith Center M.D.   On: 06/26/2023 19:50   CT Renal Stone Study  Result Date: 06/26/2023 CLINICAL DATA:  Cystoscopy 2 days ago, hematuria and dizziness for 1 day, right flank pain EXAM: CT ABDOMEN AND PELVIS WITHOUT CONTRAST TECHNIQUE: Multidetector CT imaging of the abdomen and pelvis was performed following the standard protocol without IV contrast. RADIATION DOSE REDUCTION: This exam was  performed according to the departmental dose-optimization program which includes automated exposure control, adjustment of the mA and/or kV according to patient size and/or use of iterative reconstruction  technique. COMPARISON:  06/20/2023 FINDINGS: Lower chest: No acute pleural or parenchymal lung disease. Hepatobiliary: Unremarkable unenhanced appearance of the liver and gallbladder. Pancreas: Unremarkable unenhanced appearance. Spleen: Unremarkable unenhanced appearance. Adrenals/Urinary Tract: Bladder is moderately distended, with a 6.5 by 8.4 x 7.9 cm hyperdense region within the bladder lumen consistent with blood clot after recent urologic procedure. The mass previously seen along the posterior aspect of the bladder wall is not visualized, likely resected in the interim. There is a right ureteral stent, extending from the right renal pelvis to the bladder lumen. The distal margin of the stent is coiled within the blood clot within the bladder lumen. Mild right-sided hydronephrosis has developed despite indwelling stent placement, may reflect stent malfunction due to the large bladder clot described above. Please note the calcifications seen within the right lower pelvis on prior study is separate from the right ureter, and is consistent with a phlebolith. No urinary tract calculi within either kidney. The adrenals are unremarkable. Stomach/Bowel: No bowel obstruction or ileus. Normal appendix right lower quadrant. Large amount of retained stool throughout the colon consistent with constipation. No bowel wall thickening or inflammatory change. Vascular/Lymphatic: No significant vascular findings on this unenhanced exam. No pathologic adenopathy. Reproductive: Prostate is unremarkable. Other: There is trace pelvic free fluid. No free intraperitoneal gas. No abdominal wall hernia. Musculoskeletal: No acute or destructive bony abnormalities. Reconstructed images demonstrate no additional findings. IMPRESSION: 1. Large hyperdense region within the bladder lumen, measuring up to 8.4 cm, consistent with blood clot after recent urologic procedure. 2. Interval placement of a right ureteral stent, extending from the  right renal pelvis to the bladder lumen. The distal margin of the stent is within the central aspect of the bladder could clot described above, with resulting mild right hydronephrosis. 3. Large amount of retained stool throughout the colon consistent with constipation. No bowel obstruction or ileus. 4. Trace pelvic free fluid. Electronically Signed   By: Sharlet Salina M.D.   On: 06/26/2023 19:49    Anti-infectives: Anti-infectives (From admission, onward)    None       Assessment/Plan:  Continue IV hydration in effort to dilute hematuria / clot. I feel OK to refrain from catheter / irrigation at this point unless compelling indication (frank retention). He unerstands CBI may be necessary.  Discussed his low grade favorable path. Done with bladder cancer treatment, explained approx 50% recurrence rate and need for surveillance.   Am H/H and hopefully DC tomorrow if Hgb stable and voiding w/o issue.  Reg diet as no acute OR indications at this point.   Loletta Parish. 06/27/2023

## 2023-06-27 NOTE — Progress Notes (Signed)
   06/27/23 1049  TOC Brief Assessment  Insurance and Status Lapsed  Patient has primary care physician No  Home environment has been reviewed home with friends  Prior level of function: independent  Prior/Current Home Services  --   Social Determinants of Health Reivew SDOH reviewed no interventions necessary  Readmission risk has been reviewed Yes  Transition of care needs Monitor for possible medication assist at dc   Met with pt this morning to discuss assistance with PCP assignment.  Pt is currently uninsured and without PCP.  Have offered to refer to one of the local [facility] clinics.  Pt states he "should be getting soon so I'd rather just handle that on my own." Pt declines assistance at this time.  Pt may need assistance with medication costs at dc.

## 2023-06-28 LAB — HEMOGLOBIN AND HEMATOCRIT, BLOOD
HCT: 36.7 % — ABNORMAL LOW (ref 39.0–52.0)
Hemoglobin: 12.6 g/dL — ABNORMAL LOW (ref 13.0–17.0)

## 2023-06-28 MED ORDER — CEPHALEXIN 500 MG PO CAPS: 500.0000 mg | ORAL_CAPSULE | Freq: Two times a day (BID) | ORAL | 0 refills | Status: AC

## 2023-06-28 NOTE — Progress Notes (Signed)
Discussed with pt that his urine is still very bloody.  We discussed that he had previously refused a foley and that is typically placed for pt's with this diagnosis.  He expressed fear.  He asked if he could be put to sleep to not feel it.  I told him we could maybe give lidocaine jelly if he needs to get a foley if he continues to have difficulty urinating and passing clots.  He expressed understanding and seemed agreeable to foley if that need arises.  Pt stated that if necessary, he would rather do what he needs  to to get better so he can be discharged.  PT stated that he would continue to allow staff to monitor his urine

## 2023-06-28 NOTE — Discharge Instructions (Signed)
1 - You may have urinary urgency (bladder spasms) and bloody urine on / off with stent in place. This is normal. ° °2 - Call MD or go to ER for fever >102, severe pain / nausea / vomiting not relieved by medications, or acute change in medical status ° °

## 2023-06-28 NOTE — Discharge Summary (Deleted)
Physician Discharge Summary  Patient ID: Randy Cross MRN: [id number] DOB/AGE: [birth year] 32 y.o.  Admit date: 06/26/2023 Discharge date: 06/28/2023  Admission Diagnoses:  Discharge Diagnoses:  Principal Problem:   Hematuria   Discharged Condition: stable  Hospital Course: Patient was readmitted for gross hematuria with clot retention.  He refused Foley catheter and ultimately voided a large volume of clot.  Kept for observation.  Hemoglobin 12.6 this morning.  Subjectively states that his urine is starting to clear up but still having some gross hematuria which is expected after the procedure that he had.  States he is no longer passing very many clots.  Consults: None  Significant Diagnostic Studies: None  Treatments: None  Discharge Exam: Blood pressure 123/70, pulse 69, temperature 98.2 F (36.8 C), temperature source Oral, resp. rate 18, height 5\' 11"  (1.803 m), weight 66.7 kg, SpO2 100%. General appearance: alert no acute distress Adequate perfusion of extremities Nonlabored respiration Abdomen soft, nontender, nondistended Urine dark red in the toilet  Disposition: Discharge disposition: 01-Home or Self Care        Allergies as of 06/28/2023   No Known Allergies      Medication List     TAKE these medications    cephALEXin 500 MG capsule Commonly known as: KEFLEX Take 1 capsule (500 mg total) by mouth 2 (two) times daily for 3 days. Begin day before next Urology appointment.   ketorolac 10 MG tablet Commonly known as: TORADOL Take 1 tablet (10 mg total) by mouth every 6 (six) hours as needed for moderate pain (or stent discomfort post-operatively).   oxyCODONE-acetaminophen 5-325 MG tablet Commonly known as: Percocet Take 1-2 tablets by mouth every 6 (six) hours as needed for severe pain (post-operatively).   senna-docusate 8.6-50 MG tablet Commonly known as: Senokot-S Take 1 tablet by mouth 2 (two) times daily. While taking strongest pain meds to  prevent constipation         Signed: Ray Church, III 06/28/2023, 9:44 AM

## 2023-06-28 NOTE — Progress Notes (Signed)
Urology Inpatient Progress Report  Gross hematuria [R31.0] Hematuria [R31.9]        Intv/Subj: Patient was voiding better but nursing called and he is having some difficulty intermittently and passing a few clots.  Principal Problem:   Hematuria  Current Facility-Administered Medications  Medication Dose Route Frequency Provider Last Rate Last Admin   0.9 %  sodium chloride infusion   Intravenous Continuous Berneice Heinrich Delbert Phenix., MD 100 mL/hr at 06/27/23 2314 New Bag at 06/27/23 2314   diphenhydrAMINE (BENADRYL) injection 12.5 mg  12.5 mg Intravenous Q6H PRN Heloise Purpura, MD       Or   diphenhydrAMINE (BENADRYL) 12.5 MG/5ML elixir 12.5 mg  12.5 mg Oral Q6H PRN Heloise Purpura, MD   12.5 mg at 06/27/23 2208   feeding supplement (ENSURE ENLIVE / ENSURE PLUS) liquid 237 mL  237 mL Oral BID BM Loletta Parish., MD   237 mL at 06/28/23 0949   HYDROmorphone (DILAUDID) injection 0.5-1 mg  0.5-1 mg Intravenous Q2H PRN Heloise Purpura, MD   1 mg at 06/27/23 1439   lidocaine (XYLOCAINE) 2 % jelly 1 Application  1 Application Topical Once Charlynne Pander, MD       ondansetron [facility]) injection 4 mg  4 mg Intravenous Q4H PRN Heloise Purpura, MD       oxyCODONE-acetaminophen (PERCOCET/ROXICET) 5-325 MG per tablet 1-2 tablet  1-2 tablet Oral Q6H PRN Heloise Purpura, MD   2 tablet at 06/28/23 1226   senna-docusate (Senokot-S) tablet 1 tablet  1 tablet Oral BID Loletta Parish., MD   1 tablet at 06/28/23 4098     Objective: Vital: Vitals:   06/27/23 0542 06/27/23 1327 06/27/23 2157 06/28/23 0613  BP: 113/76 132/79 121/76 123/70  Pulse: (!) 57 67 77 69  Resp: 16 16 18 18   Temp: 97.8 F (36.6 C) 98.4 F (36.9 C) 98.7 F (37.1 C) 98.2 F (36.8 C)  TempSrc: Oral Oral Oral Oral  SpO2: 99% 99% 99% 100%  Weight:      Height:       I/Os: I/O last 3 completed shifts: In: 3633.7 [P.O.:990; I.V.:2643.7] Out: 553 [Urine:553]  Physical Exam:  General: Patient is in no apparent  distress Lungs: Normal respiratory effort, chest expands symmetrically. GI: The abdomen is soft and nontender without mass. Ext: lower extremities symmetric  Lab Results: Recent Labs    06/26/23 1658 06/27/23 0333 06/28/23 0351  WBC 6.3  --   --   HGB 14.9 13.7 12.6*  HCT 43.8 41.7 36.7*   Recent Labs    06/26/23 1658  NA 138  K 3.9  CL 101  CO2 28  GLUCOSE 98  BUN 21*  CREATININE 0.96  CALCIUM 9.4   No results for input(s): "LABPT", "INR" in the last 72 hours. No results for input(s): "LABURIN" in the last 72 hours. Results for orders placed or performed during the hospital encounter of 07/28/14  Rapid strep screen     Status: None   Collection Time: 07/28/14  6:32 PM   Specimen: Oral Mucosa/Gingiva; Throat  Result Value Ref Range Status   Streptococcus, Group A Screen (Direct) NEGATIVE NEGATIVE Final    Comment: (NOTE) A Rapid Antigen test may result negative if the antigen level in the sample is below the detection level of this test. The FDA has not cleared this test as a stand-alone test therefore the rapid antigen negative result has reflexed to a Group A Strep culture.  Culture, Group A Strep  Status: None   Collection Time: 07/28/14  6:32 PM   Specimen: Throat  Result Value Ref Range Status   Specimen Description THROAT  Final   Special Requests NONE  Final   Culture   Final    No Beta Hemolytic Streptococci Isolated Performed at [facility]   Report Status 07/30/2014 FINAL  Final    Studies/Results: CT Angio Chest PE W and/or Wo Contrast  Result Date: 06/26/2023 CLINICAL DATA:  Dizziness. High probability for pulmonary embolus. History of right ureteral stone bladder neoplasm. Status post cystoscopy and TURBT 2 days ago. EXAM: CT ANGIOGRAPHY CHEST WITH CONTRAST TECHNIQUE: Multidetector CT imaging of the chest was performed using the standard protocol during bolus administration of intravenous contrast. Multiplanar CT image reconstructions  and MIPs were obtained to evaluate the vascular anatomy. RADIATION DOSE REDUCTION: This exam was performed according to the departmental dose-optimization program which includes automated exposure control, adjustment of the mA and/or kV according to patient size and/or use of iterative reconstruction technique. CONTRAST:  75mL OMNIPAQUE IOHEXOL 350 MG/ML SOLN COMPARISON:  No comparison studies available. FINDINGS: Cardiovascular: The heart size is normal. No substantial pericardial effusion. There is no filling defect within the opacified pulmonary arteries to suggest the presence of an acute pulmonary embolus. Mediastinum/Nodes: 13 mm short axis subcarinal lymph node seen on 79/5. No hilar lymphadenopathy. The esophagus has normal imaging features. There is no axillary lymphadenopathy. Lungs/Pleura: No suspicious pulmonary nodule or mass. No focal airspace consolidation. No pleural effusion. Dependent atelectasis noted in the lower lungs bilaterally. Upper Abdomen: Unremarkable Musculoskeletal: No worrisome lytic or sclerotic osseous abnormality. Review of the MIP images confirms the above findings. IMPRESSION: 1. No CT evidence for acute pulmonary embolus. 2. 13 mm short axis subcarinal lymph node. This is nonspecific and may be reactive. Follow-up CT chest in 3 months recommended to ensure stability. Electronically Signed   By: Kennith Center M.D.   On: 06/26/2023 19:50   CT Renal Stone Study  Result Date: 06/26/2023 CLINICAL DATA:  Cystoscopy 2 days ago, hematuria and dizziness for 1 day, right flank pain EXAM: CT ABDOMEN AND PELVIS WITHOUT CONTRAST TECHNIQUE: Multidetector CT imaging of the abdomen and pelvis was performed following the standard protocol without IV contrast. RADIATION DOSE REDUCTION: This exam was performed according to the departmental dose-optimization program which includes automated exposure control, adjustment of the mA and/or kV according to patient size and/or use of iterative  reconstruction technique. COMPARISON:  06/20/2023 FINDINGS: Lower chest: No acute pleural or parenchymal lung disease. Hepatobiliary: Unremarkable unenhanced appearance of the liver and gallbladder. Pancreas: Unremarkable unenhanced appearance. Spleen: Unremarkable unenhanced appearance. Adrenals/Urinary Tract: Bladder is moderately distended, with a 6.5 by 8.4 x 7.9 cm hyperdense region within the bladder lumen consistent with blood clot after recent urologic procedure. The mass previously seen along the posterior aspect of the bladder wall is not visualized, likely resected in the interim. There is a right ureteral stent, extending from the right renal pelvis to the bladder lumen. The distal margin of the stent is coiled within the blood clot within the bladder lumen. Mild right-sided hydronephrosis has developed despite indwelling stent placement, may reflect stent malfunction due to the large bladder clot described above. Please note the calcifications seen within the right lower pelvis on prior study is separate from the right ureter, and is consistent with a phlebolith. No urinary tract calculi within either kidney. The adrenals are unremarkable. Stomach/Bowel: No bowel obstruction or ileus. Normal appendix right lower quadrant. Large amount of  retained stool throughout the colon consistent with constipation. No bowel wall thickening or inflammatory change. Vascular/Lymphatic: No significant vascular findings on this unenhanced exam. No pathologic adenopathy. Reproductive: Prostate is unremarkable. Other: There is trace pelvic free fluid. No free intraperitoneal gas. No abdominal wall hernia. Musculoskeletal: No acute or destructive bony abnormalities. Reconstructed images demonstrate no additional findings. IMPRESSION: 1. Large hyperdense region within the bladder lumen, measuring up to 8.4 cm, consistent with blood clot after recent urologic procedure. 2. Interval placement of a right ureteral stent,  extending from the right renal pelvis to the bladder lumen. The distal margin of the stent is within the central aspect of the bladder could clot described above, with resulting mild right hydronephrosis. 3. Large amount of retained stool throughout the colon consistent with constipation. No bowel obstruction or ileus. 4. Trace pelvic free fluid. Electronically Signed   By: Sharlet Salina M.D.   On: 06/26/2023 19:49    Assessment: Gross hematuria  Plan: If he develops retention, will need foley. Will make NPO at MN so if has continued issues can consider clot evac    Modena Slater, MD Urology 06/28/2023, 1:07 PM

## 2023-06-28 NOTE — Plan of Care (Signed)
  Problem: Education: Goal: Knowledge of General Education information will improve Description: Including pain rating scale, medication(s)/side effects and non-pharmacologic comfort measures Outcome: Progressing   Problem: Health Behavior/Discharge Planning: Goal: Ability to manage health-related needs will improve Outcome: Progressing   Problem: Clinical Measurements: Goal: Will remain free from infection Outcome: Progressing   

## 2023-06-29 LAB — HEMOGLOBIN AND HEMATOCRIT, BLOOD
HCT: 35.1 % — ABNORMAL LOW (ref 39.0–52.0)
Hemoglobin: 11.9 g/dL — ABNORMAL LOW (ref 13.0–17.0)

## 2023-06-29 MED ORDER — OXYCODONE-ACETAMINOPHEN 5-325 MG PO TABS
1.0000 | ORAL_TABLET | Freq: Four times a day (QID) | ORAL | 0 refills | Status: DC | PRN
Start: 2023-06-29 — End: 2024-05-29

## 2023-06-29 NOTE — Discharge Summary (Signed)
Physician Discharge Summary  Patient ID: Randy Cross MRN: [id number] DOB/AGE: 01/02/1991 32 y.o.  Admit date: 06/26/2023 Discharge date: 06/29/2023  Admission Diagnoses: Hematuria After Bladder Tumor Resection  Discharge Diagnoses:  Principal Problem:   Hematuria   Discharged Condition: good  Hospital Course: Pt admitted through ER 06/26/23 for hematuria with large clots and near retention after uncomplicated transurethral resection of bladder tumro on 8/27. ET Ct on admit with some signficant formed clot in bladder. No sig anemia. He refused foley. Managed conservatively with IV hydration / pain control and he passed significatn clot material with resumed normal voiding. By the AM of 9/1, the day of discharge, his Hgb is reasonable at 11.9, maintaining PO nutrition, pain controlled, voiding w/o issue and felt to be adequate for discharge.   Consults: None  Significant Diagnostic Studies: labs: as per above  Treatments: IV hydration  Discharge Exam: Blood pressure 123/81, pulse 66, temperature 98.4 F (36.9 C), resp. rate 16, height 5\' 11"  (1.803 m), weight 66.7 kg, SpO2 98%.  NAD, family in room Non-labored breathing on RA RRR SNTND No SP TTP or distension No c/c/e  Disposition: Discharge disposition: 01-Home or Self Care        Allergies as of 06/29/2023   No Known Allergies      Medication List     TAKE these medications    cephALEXin 500 MG capsule Commonly known as: KEFLEX Take 1 capsule (500 mg total) by mouth 2 (two) times daily for 3 days. Begin day before next Urology appointment.   ketorolac 10 MG tablet Commonly known as: TORADOL Take 1 tablet (10 mg total) by mouth every 6 (six) hours as needed for moderate pain (or stent discomfort post-operatively).   oxyCODONE-acetaminophen 5-325 MG tablet Commonly known as: Percocet Take 1-2 tablets by mouth every 6 (six) hours as needed for severe pain (post-operatively).   senna-docusate 8.6-50 MG  tablet Commonly known as: Senokot-S Take 1 tablet by mouth 2 (two) times daily. While taking strongest pain meds to prevent constipation         Signed: Gypsy Balsam Jr. 06/29/2023, 9:22 AM

## 2023-07-03 LAB — SURGICAL PATHOLOGY

## 2024-05-29 ENCOUNTER — Other Ambulatory Visit: Payer: Self-pay

## 2024-05-29 ENCOUNTER — Emergency Department (HOSPITAL_COMMUNITY)
Admission: EM | Admit: 2024-05-29 | Discharge: 2024-05-29 | Disposition: A | Discharge status: Home / Self Care | Payer: Self-pay | Attending: Emergency Medicine | Admitting: Emergency Medicine

## 2024-05-29 ENCOUNTER — Emergency Department (HOSPITAL_COMMUNITY): Payer: Self-pay

## 2024-05-29 ENCOUNTER — Encounter (HOSPITAL_COMMUNITY): Payer: Self-pay

## 2024-05-29 DIAGNOSIS — S92352A Displaced fracture of fifth metatarsal bone, left foot, initial encounter for closed fracture: Secondary | ICD-10-CM | POA: Insufficient documentation

## 2024-05-29 DIAGNOSIS — X509XXA Other and unspecified overexertion or strenuous movements or postures, initial encounter: Secondary | ICD-10-CM | POA: Insufficient documentation

## 2024-05-29 MED ORDER — OXYCODONE HCL 5 MG PO TABS: 5.0000 mg | ORAL_TABLET | ORAL | 0 refills | Status: AC | PRN

## 2024-05-29 NOTE — ED Notes (Signed)
 Pt is seated on the stretcher with ice pack in place on his right foot. Visitor at bedside. Pt states he has already been to x-ray and has no needs or concerns at this time.

## 2024-05-29 NOTE — Discharge Instructions (Addendum)
 For your pain, you may take up to 1000mg  of acetaminophen  (tylenol ) 4 times daily for up to a week. This is the maximum dose of acetminophen (tylenol ) you can take from all sources. Please check other over-the-counter medications and prescriptions to ensure you are not taking other medications that contain acetaminophen .  You may also take ibuprofen 400 mg 6 times a day OR 600mg  4 times a day alternating with or at the same time as tylenol .  Take oxycodone  as needed for breakthrough pain.  This medication can be addicting, sedating and cause constipation.     Wear your boot when you are up, do not put weight on your foot.

## 2024-05-29 NOTE — ED Triage Notes (Signed)
 Pt stepped off a step wrong yesterday and injured his right foot.

## 2024-05-31 NOTE — ED Provider Notes (Signed)
 [place] EMERGENCY DEPARTMENT AT [facility] Provider Note   CSN: [id number] Arrival date & time: 05/29/24  1509     Patient presents with: Foot Injury   Randy Cross is a 33 y.o. male.   HPI     33 year old male presents with concern for right foot pain after he stepped wrong.  He stepped wrong on his foot yesterday with immediate pain, noted bruising and swelling.  He has had difficulty weightbearing.  He denies any other injuries.  No fever or other concerns.   History reviewed. No pertinent past medical history.  Prior to Admission medications   Medication Sig Start Date End Date Taking? Authorizing Provider  oxyCODONE  (ROXICODONE ) 5 MG immediate release tablet Take 1 tablet (5 mg total) by mouth every 4 (four) hours as needed for severe pain (pain score 7-10). 05/29/24  Yes Orrie Lascano, Rocky, MD  senna-docusate (SENOKOT-S) 8.6-50 MG tablet Take 1 tablet by mouth 2 (two) times daily. While taking strongest pain meds to prevent constipation 06/24/23   Alvaro Ricardo KATHEE Raddle., MD    Allergies: Patient has no known allergies.    Review of Systems  Updated Vital Signs BP (!) 144/90 (BP Location: Right Arm)   Pulse (!) 57   Temp 98.7 F (37.1 C)   Resp 18   Ht 5' 11 (1.803 m)   Wt 74.8 kg   SpO2 100%   BMI 23.01 kg/m   Physical Exam Vitals and nursing note reviewed.  Constitutional:      General: He is not in acute distress.    Appearance: Normal appearance. He is not ill-appearing, toxic-appearing or diaphoretic.  HENT:     Head: Normocephalic.  Eyes:     Conjunctiva/sclera: Conjunctivae normal.  Cardiovascular:     Rate and Rhythm: Normal rate and regular rhythm.     Pulses: Normal pulses.  Pulmonary:     Effort: Pulmonary effort is normal. No respiratory distress.  Musculoskeletal:        General: Swelling (right foot) and tenderness (right 5th metatarsal) present. No deformity or signs of injury.     Cervical back: No rigidity.  Skin:     General: Skin is warm and dry.     Coloration: Skin is not jaundiced or pale.  Neurological:     General: No focal deficit present.     Mental Status: He is alert and oriented to person, place, and time.     (all labs ordered are listed, but only abnormal results are displayed) Labs Reviewed - No data to display  EKG: None  Radiology: DG Foot Complete Right Result Date: 05/29/2024 EXAM: 3 VIEW(S) XRAY OF THE RIGHT FOOT 05/29/2024 03:27:00 PM COMPARISON: CT of the right foot dated 08/12/2018. CLINICAL HISTORY: Pt stepped off a step wrong yesterday and injured his right foot. FINDINGS: BONES AND JOINTS: There is a mildly displaced fracture of the base of the fifth metatarsal. SOFT TISSUES: There is mild overlying soft tissue swelling. IMPRESSION: 1. Mildly displaced fracture of the base of the fifth metatarsal with mild overlying soft tissue swelling. Electronically signed by: evalene coho 05/29/2024 03:31 PM EDT RP Workstation: [id number]     Procedures   Medications Ordered in the ED - No data to display                                    33 year old male presents with concern for right foot  pain after he stepped wrong.  He is neurologically vascularly intact, with normal pulses, no signs of other injuries.  X-ray of the foot was completed and personally eval and interpreted by me showed 1/5 metatarsal fracture.  Given location, concern for possible Jones fracture and will place him in a cam boot, make him nonweightbearing until he follows up with orthopedics.  Recommend Tylenol , ibuprofen for pain, given prescription for oxycodone . Patient discharged in stable condition with understanding of reasons to return.      Final diagnoses:  Closed displaced fracture of fifth metatarsal bone of left foot, initial encounter    ED Discharge Orders          Ordered    oxyCODONE  (ROXICODONE ) 5 MG immediate release tablet  Every 4 hours PRN        05/29/24 1607                Dreama Longs, MD 05/31/24 1220
